# Patient Record
Sex: Male | Born: 1980 | Race: White | Hispanic: No | Marital: Single | Smoking: Never smoker
Health system: Southern US, Community
[De-identification: ages and names within clinical notes are randomized; demographics above are authoritative.]

## PROBLEM LIST (undated history)

## (undated) ENCOUNTER — Emergency Department (HOSPITAL_COMMUNITY): Admission: EM | Payer: PRIVATE HEALTH INSURANCE | Source: Home / Self Care

---
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
## 2008-05-12 ENCOUNTER — Emergency Department (HOSPITAL_COMMUNITY): Admission: EM | Admit: 2008-05-12 | Discharge: 2008-05-12 | Payer: Self-pay | Admitting: Emergency Medicine

## 2010-06-26 ENCOUNTER — Encounter (HOSPITAL_COMMUNITY)
Admission: RE | Admit: 2010-06-26 | Discharge: 2010-09-24 | Payer: Self-pay | Source: Home / Self Care | Attending: Unknown Physician Specialty | Admitting: Unknown Physician Specialty

## 2010-10-09 ENCOUNTER — Encounter (HOSPITAL_COMMUNITY)
Admission: RE | Admit: 2010-10-09 | Discharge: 2010-11-07 | Payer: Self-pay | Source: Home / Self Care | Attending: Unknown Physician Specialty | Admitting: Unknown Physician Specialty

## 2010-12-04 ENCOUNTER — Encounter (HOSPITAL_COMMUNITY): Payer: 59 | Attending: Rheumatology

## 2010-12-04 DIAGNOSIS — H209 Unspecified iridocyclitis: Secondary | ICD-10-CM | POA: Insufficient documentation

## 2011-01-29 ENCOUNTER — Encounter (HOSPITAL_COMMUNITY): Payer: 59 | Attending: Rheumatology

## 2011-01-29 DIAGNOSIS — H209 Unspecified iridocyclitis: Secondary | ICD-10-CM | POA: Insufficient documentation

## 2011-03-26 ENCOUNTER — Encounter (HOSPITAL_COMMUNITY): Payer: Commercial Managed Care - PPO

## 2011-03-26 ENCOUNTER — Encounter (HOSPITAL_COMMUNITY): Payer: 59 | Attending: Rheumatology

## 2011-03-26 DIAGNOSIS — H209 Unspecified iridocyclitis: Secondary | ICD-10-CM | POA: Insufficient documentation

## 2011-05-21 ENCOUNTER — Encounter (HOSPITAL_COMMUNITY): Payer: Commercial Managed Care - PPO

## 2011-05-30 ENCOUNTER — Encounter (HOSPITAL_COMMUNITY): Payer: 59 | Attending: Rheumatology

## 2011-05-30 DIAGNOSIS — H209 Unspecified iridocyclitis: Secondary | ICD-10-CM | POA: Insufficient documentation

## 2011-07-06 LAB — POCT I-STAT, CHEM 8
Calcium, Ion: 1.35 — ABNORMAL HIGH
Creatinine, Ser: 1.7 — ABNORMAL HIGH
Glucose, Bld: 91
Hemoglobin: 12.9 — ABNORMAL LOW
Potassium: 4.3
TCO2: 19

## 2011-07-25 ENCOUNTER — Encounter (HOSPITAL_COMMUNITY): Payer: 59 | Attending: Rheumatology

## 2011-07-25 DIAGNOSIS — H209 Unspecified iridocyclitis: Secondary | ICD-10-CM | POA: Insufficient documentation

## 2011-09-13 ENCOUNTER — Other Ambulatory Visit: Payer: Self-pay | Admitting: Rheumatology

## 2011-09-17 ENCOUNTER — Encounter (HOSPITAL_COMMUNITY)
Admission: RE | Admit: 2011-09-17 | Discharge: 2011-09-17 | Disposition: A | Discharge status: Home / Self Care | Payer: 59 | Source: Ambulatory Visit | Attending: Rheumatology | Admitting: Rheumatology

## 2011-09-17 DIAGNOSIS — H209 Unspecified iridocyclitis: Secondary | ICD-10-CM | POA: Insufficient documentation

## 2011-09-17 MED ORDER — SODIUM CHLORIDE 0.9 % IV SOLN
500.0000 mg | INTRAVENOUS | Status: DC
  Administered 2011-09-17: 500 mg via INTRAVENOUS
  Filled 2011-09-17: qty 50

## 2011-11-12 ENCOUNTER — Encounter (HOSPITAL_COMMUNITY): Payer: 59

## 2011-12-31 ENCOUNTER — Other Ambulatory Visit (HOSPITAL_COMMUNITY): Payer: Self-pay | Admitting: *Deleted

## 2012-01-01 ENCOUNTER — Encounter (HOSPITAL_COMMUNITY)
Admission: RE | Admit: 2012-01-01 | Discharge: 2012-01-01 | Disposition: A | Discharge status: Home / Self Care | Payer: 59 | Source: Ambulatory Visit | Attending: Rheumatology | Admitting: Rheumatology

## 2012-01-01 DIAGNOSIS — H209 Unspecified iridocyclitis: Secondary | ICD-10-CM | POA: Insufficient documentation

## 2012-01-01 MED ORDER — SODIUM CHLORIDE 0.9 % IV SOLN
3.0000 mg/kg | INTRAVENOUS | Status: DC
  Administered 2012-01-01: 500 mg via INTRAVENOUS
  Filled 2012-01-01: qty 50

## 2012-01-01 MED ORDER — ACETAMINOPHEN 325 MG PO TABS: 650.0000 mg | ORAL_TABLET | Freq: Once | ORAL | Status: DC

## 2012-01-08 DIAGNOSIS — H44119 Panuveitis, unspecified eye: Secondary | ICD-10-CM | POA: Insufficient documentation

## 2012-02-26 ENCOUNTER — Encounter (HOSPITAL_COMMUNITY)
Admission: RE | Admit: 2012-02-26 | Discharge: 2012-02-26 | Disposition: A | Discharge status: Home / Self Care | Payer: 59 | Source: Ambulatory Visit | Attending: Rheumatology | Admitting: Rheumatology

## 2012-02-26 DIAGNOSIS — H209 Unspecified iridocyclitis: Secondary | ICD-10-CM | POA: Insufficient documentation

## 2012-02-26 MED ORDER — SODIUM CHLORIDE 0.9 % IV SOLN
3.0000 mg/kg | INTRAVENOUS | Status: DC
  Administered 2012-02-26: 500 mg via INTRAVENOUS
  Filled 2012-02-26: qty 50

## 2012-02-26 MED ORDER — SODIUM CHLORIDE 0.9 % IV SOLN
Freq: Once | INTRAVENOUS | Status: AC
  Administered 2012-02-26: 09:00:00 via INTRAVENOUS

## 2012-02-26 MED ORDER — ACETAMINOPHEN 325 MG PO TABS: 650.0000 mg | ORAL_TABLET | Freq: Once | ORAL | Status: DC

## 2012-04-08 DIAGNOSIS — H44133 Sympathetic uveitis, bilateral: Secondary | ICD-10-CM | POA: Insufficient documentation

## 2012-04-21 ENCOUNTER — Other Ambulatory Visit (HOSPITAL_COMMUNITY): Payer: Self-pay | Admitting: *Deleted

## 2012-04-22 ENCOUNTER — Encounter (HOSPITAL_COMMUNITY)
Admission: RE | Admit: 2012-04-22 | Discharge: 2012-04-22 | Disposition: A | Discharge status: Home / Self Care | Payer: 59 | Source: Ambulatory Visit | Attending: Rheumatology | Admitting: Rheumatology

## 2012-04-22 DIAGNOSIS — H209 Unspecified iridocyclitis: Secondary | ICD-10-CM | POA: Insufficient documentation

## 2012-04-22 MED ORDER — SODIUM CHLORIDE 0.9 % IV SOLN
INTRAVENOUS | Status: DC
  Administered 2012-04-22: 10:00:00 via INTRAVENOUS

## 2012-04-22 MED ORDER — SODIUM CHLORIDE 0.9 % IV SOLN
3.0000 mg/kg | INTRAVENOUS | Status: DC
  Administered 2012-04-22: 500 mg via INTRAVENOUS
  Filled 2012-04-22: qty 50

## 2012-04-22 MED ORDER — ACETAMINOPHEN 325 MG PO TABS: 650.0000 mg | ORAL_TABLET | ORAL | Status: DC

## 2012-06-17 ENCOUNTER — Encounter (HOSPITAL_COMMUNITY)
Admission: RE | Admit: 2012-06-17 | Discharge: 2012-06-17 | Disposition: A | Discharge status: Home / Self Care | Payer: 59 | Source: Ambulatory Visit | Attending: Rheumatology | Admitting: Rheumatology

## 2012-06-17 DIAGNOSIS — H209 Unspecified iridocyclitis: Secondary | ICD-10-CM | POA: Insufficient documentation

## 2012-06-17 MED ORDER — ACETAMINOPHEN 325 MG PO TABS: 650.0000 mg | ORAL_TABLET | ORAL | Status: DC

## 2012-06-17 MED ORDER — SODIUM CHLORIDE 0.9 % IV SOLN
INTRAVENOUS | Status: AC
  Administered 2012-06-17: 08:00:00 via INTRAVENOUS

## 2012-06-17 MED ORDER — SODIUM CHLORIDE 0.9 % IV SOLN
3.0000 mg/kg | INTRAVENOUS | Status: AC
  Administered 2012-06-17: 500 mg via INTRAVENOUS
  Filled 2012-06-17: qty 50

## 2012-07-11 DIAGNOSIS — Z79899 Other long term (current) drug therapy: Secondary | ICD-10-CM | POA: Insufficient documentation

## 2012-08-12 ENCOUNTER — Encounter (HOSPITAL_COMMUNITY): Payer: 59

## 2012-08-18 ENCOUNTER — Encounter (HOSPITAL_COMMUNITY)
Admission: RE | Admit: 2012-08-18 | Discharge: 2012-08-18 | Disposition: A | Discharge status: Home / Self Care | Payer: 59 | Source: Ambulatory Visit | Attending: Rheumatology | Admitting: Rheumatology

## 2012-08-18 DIAGNOSIS — H209 Unspecified iridocyclitis: Secondary | ICD-10-CM | POA: Insufficient documentation

## 2012-08-18 MED ORDER — ACETAMINOPHEN 325 MG PO TABS
ORAL_TABLET | ORAL | Status: AC
  Administered 2012-08-18: 650 mg
  Filled 2012-08-18: qty 2

## 2012-08-18 MED ORDER — SODIUM CHLORIDE 0.9 % IV SOLN
5.0000 mg/kg | INTRAVENOUS | Status: DC
  Administered 2012-08-18: 700 mg via INTRAVENOUS
  Filled 2012-08-18: qty 70

## 2012-08-18 MED ORDER — SODIUM CHLORIDE 0.9 % IV SOLN
INTRAVENOUS | Status: AC
  Administered 2012-08-18: 10:00:00 via INTRAVENOUS

## 2012-08-18 MED ORDER — ACETAMINOPHEN 325 MG PO TABS: 650.0000 mg | ORAL_TABLET | ORAL | Status: DC

## 2012-09-08 ENCOUNTER — Other Ambulatory Visit: Payer: Self-pay | Admitting: Nephrology

## 2012-09-08 DIAGNOSIS — I10 Essential (primary) hypertension: Secondary | ICD-10-CM

## 2012-09-15 ENCOUNTER — Ambulatory Visit
Admission: RE | Admit: 2012-09-15 | Discharge: 2012-09-15 | Disposition: A | Discharge status: Home / Self Care | Payer: 59 | Source: Ambulatory Visit | Attending: Nephrology | Admitting: Nephrology

## 2012-09-15 DIAGNOSIS — I10 Essential (primary) hypertension: Secondary | ICD-10-CM

## 2012-10-10 ENCOUNTER — Other Ambulatory Visit (HOSPITAL_COMMUNITY): Payer: Self-pay | Admitting: *Deleted

## 2012-10-13 ENCOUNTER — Encounter (HOSPITAL_COMMUNITY)
Admission: RE | Admit: 2012-10-13 | Discharge: 2012-10-13 | Disposition: A | Discharge status: Home / Self Care | Payer: 59 | Source: Ambulatory Visit | Attending: Rheumatology | Admitting: Rheumatology

## 2012-10-13 DIAGNOSIS — H209 Unspecified iridocyclitis: Secondary | ICD-10-CM | POA: Insufficient documentation

## 2012-10-13 MED ORDER — SODIUM CHLORIDE 0.9 % IV SOLN
INTRAVENOUS | Status: AC
  Administered 2012-10-13: 09:00:00 via INTRAVENOUS

## 2012-10-13 MED ORDER — SODIUM CHLORIDE 0.9 % IV SOLN
5.0000 mg/kg | INTRAVENOUS | Status: AC
  Administered 2012-10-13: 700 mg via INTRAVENOUS
  Filled 2012-10-13: qty 70

## 2012-10-13 MED ORDER — ACETAMINOPHEN 325 MG PO TABS: 650.0000 mg | ORAL_TABLET | ORAL | Status: DC

## 2012-12-05 ENCOUNTER — Other Ambulatory Visit (HOSPITAL_COMMUNITY): Payer: Self-pay | Admitting: *Deleted

## 2012-12-08 ENCOUNTER — Encounter (HOSPITAL_COMMUNITY)
Admission: RE | Admit: 2012-12-08 | Discharge: 2012-12-08 | Disposition: A | Discharge status: Home / Self Care | Payer: 59 | Source: Ambulatory Visit | Attending: Rheumatology | Admitting: Rheumatology

## 2012-12-08 DIAGNOSIS — H209 Unspecified iridocyclitis: Secondary | ICD-10-CM | POA: Insufficient documentation

## 2012-12-08 MED ORDER — ACETAMINOPHEN 325 MG PO TABS: 650.0000 mg | ORAL_TABLET | ORAL | Status: DC

## 2012-12-08 MED ORDER — SODIUM CHLORIDE 0.9 % IV SOLN
8.0000 mg/kg | INTRAVENOUS | Status: DC
  Administered 2012-12-08: 1100 mg via INTRAVENOUS
  Filled 2012-12-08: qty 110

## 2012-12-08 MED ORDER — SODIUM CHLORIDE 0.9 % IV SOLN
INTRAVENOUS | Status: DC
  Administered 2012-12-08: 250 mL via INTRAVENOUS

## 2013-01-29 ENCOUNTER — Other Ambulatory Visit (HOSPITAL_COMMUNITY): Payer: Self-pay | Admitting: *Deleted

## 2013-02-02 ENCOUNTER — Encounter (HOSPITAL_COMMUNITY): Payer: 59

## 2013-02-06 ENCOUNTER — Other Ambulatory Visit (HOSPITAL_COMMUNITY): Payer: Self-pay | Admitting: *Deleted

## 2013-02-09 ENCOUNTER — Encounter (HOSPITAL_COMMUNITY)
Admission: RE | Admit: 2013-02-09 | Discharge: 2013-02-09 | Disposition: A | Discharge status: Home / Self Care | Payer: 59 | Source: Ambulatory Visit | Attending: Rheumatology | Admitting: Rheumatology

## 2013-02-09 DIAGNOSIS — H209 Unspecified iridocyclitis: Secondary | ICD-10-CM | POA: Insufficient documentation

## 2013-02-09 MED ORDER — SODIUM CHLORIDE 0.9 % IV SOLN
INTRAVENOUS | Status: DC
  Administered 2013-02-09: 08:00:00 via INTRAVENOUS

## 2013-02-09 MED ORDER — SODIUM CHLORIDE 0.9 % IV SOLN
8.0000 mg/kg | INTRAVENOUS | Status: DC
  Administered 2013-02-09: 1100 mg via INTRAVENOUS
  Filled 2013-02-09: qty 110

## 2013-02-09 MED ORDER — ACETAMINOPHEN 325 MG PO TABS: 650.0000 mg | ORAL_TABLET | ORAL | Status: DC

## 2013-03-25 DIAGNOSIS — Z961 Presence of intraocular lens: Secondary | ICD-10-CM | POA: Insufficient documentation

## 2013-04-06 ENCOUNTER — Encounter (HOSPITAL_COMMUNITY)
Admission: RE | Admit: 2013-04-06 | Discharge: 2013-04-06 | Disposition: A | Discharge status: Home / Self Care | Payer: 59 | Source: Ambulatory Visit | Attending: Rheumatology | Admitting: Rheumatology

## 2013-04-06 DIAGNOSIS — H209 Unspecified iridocyclitis: Secondary | ICD-10-CM | POA: Insufficient documentation

## 2013-04-06 MED ORDER — SODIUM CHLORIDE 0.9 % IV SOLN
INTRAVENOUS | Status: AC
  Administered 2013-04-06: 08:00:00 via INTRAVENOUS

## 2013-04-06 MED ORDER — ACETAMINOPHEN 325 MG PO TABS: 650.0000 mg | ORAL_TABLET | ORAL | Status: DC

## 2013-04-06 MED ORDER — SODIUM CHLORIDE 0.9 % IV SOLN
8.0000 mg/kg | INTRAVENOUS | Status: AC
  Administered 2013-04-06: 1100 mg via INTRAVENOUS
  Filled 2013-04-06: qty 110

## 2013-05-15 DIAGNOSIS — H26499 Other secondary cataract, unspecified eye: Secondary | ICD-10-CM | POA: Insufficient documentation

## 2013-06-01 ENCOUNTER — Encounter (HOSPITAL_COMMUNITY): Payer: 59

## 2013-06-15 ENCOUNTER — Encounter (HOSPITAL_COMMUNITY)
Admission: RE | Admit: 2013-06-15 | Discharge: 2013-06-15 | Disposition: A | Discharge status: Home / Self Care | Payer: 59 | Source: Ambulatory Visit | Attending: Rheumatology | Admitting: Rheumatology

## 2013-06-15 DIAGNOSIS — H209 Unspecified iridocyclitis: Secondary | ICD-10-CM | POA: Insufficient documentation

## 2013-06-15 MED ORDER — SODIUM CHLORIDE 0.9 % IV SOLN
8.0000 mg/kg | INTRAVENOUS | Status: DC
  Administered 2013-06-15: 900 mg via INTRAVENOUS
  Filled 2013-06-15: qty 90

## 2013-06-15 MED ORDER — SODIUM CHLORIDE 0.9 % IV SOLN
INTRAVENOUS | Status: DC
  Administered 2013-06-15: 250 mL via INTRAVENOUS

## 2013-06-15 MED ORDER — ACETAMINOPHEN 325 MG PO TABS: 650.0000 mg | ORAL_TABLET | ORAL | Status: DC

## 2013-08-07 ENCOUNTER — Other Ambulatory Visit (HOSPITAL_COMMUNITY): Payer: Self-pay | Admitting: *Deleted

## 2013-08-10 ENCOUNTER — Encounter (HOSPITAL_COMMUNITY)
Admission: RE | Admit: 2013-08-10 | Discharge: 2013-08-10 | Disposition: A | Discharge status: Home / Self Care | Payer: 59 | Source: Ambulatory Visit | Attending: Rheumatology | Admitting: Rheumatology

## 2013-08-10 DIAGNOSIS — H209 Unspecified iridocyclitis: Secondary | ICD-10-CM | POA: Insufficient documentation

## 2013-08-10 MED ORDER — ACETAMINOPHEN 325 MG PO TABS
ORAL_TABLET | ORAL | Status: AC
  Filled 2013-08-10: qty 2

## 2013-08-10 MED ORDER — SODIUM CHLORIDE 0.9 % IV SOLN
8.0000 mg/kg | INTRAVENOUS | Status: DC
  Administered 2013-08-10: 800 mg via INTRAVENOUS
  Filled 2013-08-10: qty 80

## 2013-08-10 MED ORDER — ACETAMINOPHEN 325 MG PO TABS
650.0000 mg | ORAL_TABLET | ORAL | Status: DC
  Administered 2013-08-10: 650 mg via ORAL

## 2013-08-10 MED ORDER — SODIUM CHLORIDE 0.9 % IV SOLN
INTRAVENOUS | Status: DC
  Administered 2013-08-10: 10:00:00 via INTRAVENOUS

## 2013-09-29 ENCOUNTER — Other Ambulatory Visit (HOSPITAL_COMMUNITY): Payer: Self-pay | Admitting: *Deleted

## 2013-10-05 ENCOUNTER — Encounter (HOSPITAL_COMMUNITY): Payer: 59

## 2013-10-16 ENCOUNTER — Other Ambulatory Visit (HOSPITAL_COMMUNITY): Payer: Self-pay

## 2013-10-19 ENCOUNTER — Encounter (HOSPITAL_COMMUNITY)
Admission: RE | Admit: 2013-10-19 | Discharge: 2013-10-19 | Disposition: A | Discharge status: Home / Self Care | Payer: 59 | Source: Ambulatory Visit | Attending: Rheumatology | Admitting: Rheumatology

## 2013-10-19 DIAGNOSIS — H209 Unspecified iridocyclitis: Secondary | ICD-10-CM | POA: Insufficient documentation

## 2013-10-19 MED ORDER — ACETAMINOPHEN 325 MG PO TABS
650.0000 mg | ORAL_TABLET | ORAL | Status: AC
  Administered 2013-10-19: 650 mg via ORAL

## 2013-10-19 MED ORDER — INFLIXIMAB 100 MG IV SOLR
8.0000 mg/kg | INTRAVENOUS | Status: AC
  Administered 2013-10-19: 800 mg via INTRAVENOUS
  Filled 2013-10-19: qty 80

## 2013-10-19 MED ORDER — SODIUM CHLORIDE 0.9 % IV SOLN
INTRAVENOUS | Status: AC
  Administered 2013-10-19: 09:00:00 via INTRAVENOUS

## 2013-10-19 MED ORDER — ACETAMINOPHEN 325 MG PO TABS
ORAL_TABLET | ORAL | Status: AC
  Administered 2013-10-19: 08:00:00 650 mg via ORAL
  Filled 2013-10-19: qty 2

## 2013-12-14 ENCOUNTER — Encounter (HOSPITAL_COMMUNITY)
Admission: RE | Admit: 2013-12-14 | Discharge: 2013-12-14 | Disposition: A | Discharge status: Home / Self Care | Payer: 59 | Source: Ambulatory Visit | Attending: Rheumatology | Admitting: Rheumatology

## 2013-12-14 DIAGNOSIS — H209 Unspecified iridocyclitis: Secondary | ICD-10-CM | POA: Insufficient documentation

## 2013-12-14 MED ORDER — SODIUM CHLORIDE 0.9 % IV SOLN
8.0000 mg/kg | INTRAVENOUS | Status: AC
  Administered 2013-12-14: 800 mg via INTRAVENOUS
  Filled 2013-12-14: qty 80

## 2013-12-14 MED ORDER — ACETAMINOPHEN 325 MG PO TABS
ORAL_TABLET | ORAL | Status: AC
  Filled 2013-12-14: qty 2

## 2013-12-14 MED ORDER — ACETAMINOPHEN 325 MG PO TABS
650.0000 mg | ORAL_TABLET | ORAL | Status: AC
  Administered 2013-12-14: 650 mg via ORAL

## 2013-12-14 MED ORDER — SODIUM CHLORIDE 0.9 % IV SOLN
INTRAVENOUS | Status: AC
  Administered 2013-12-14: 09:00:00 via INTRAVENOUS

## 2014-02-08 ENCOUNTER — Encounter (HOSPITAL_COMMUNITY)
Admission: RE | Admit: 2014-02-08 | Discharge: 2014-02-08 | Disposition: A | Discharge status: Home / Self Care | Payer: 59 | Source: Ambulatory Visit | Attending: Rheumatology | Admitting: Rheumatology

## 2014-02-08 DIAGNOSIS — H209 Unspecified iridocyclitis: Secondary | ICD-10-CM | POA: Insufficient documentation

## 2014-02-08 MED ORDER — SODIUM CHLORIDE 0.9 % IV SOLN
INTRAVENOUS | Status: AC
  Administered 2014-02-08: 09:00:00 via INTRAVENOUS

## 2014-02-08 MED ORDER — SODIUM CHLORIDE 0.9 % IV SOLN
8.0000 mg/kg | INTRAVENOUS | Status: AC
  Administered 2014-02-08: 800 mg via INTRAVENOUS
  Filled 2014-02-08: qty 80

## 2014-02-08 MED ORDER — ACETAMINOPHEN 325 MG PO TABS: 650.0000 mg | ORAL_TABLET | ORAL | Status: DC

## 2014-04-02 ENCOUNTER — Other Ambulatory Visit (HOSPITAL_COMMUNITY): Payer: Self-pay | Admitting: *Deleted

## 2014-04-05 ENCOUNTER — Encounter (HOSPITAL_COMMUNITY)
Admission: RE | Admit: 2014-04-05 | Discharge: 2014-04-05 | Disposition: A | Discharge status: Home / Self Care | Payer: 59 | Source: Ambulatory Visit | Attending: Rheumatology | Admitting: Rheumatology

## 2014-04-05 DIAGNOSIS — H209 Unspecified iridocyclitis: Secondary | ICD-10-CM | POA: Insufficient documentation

## 2014-04-05 LAB — COMPREHENSIVE METABOLIC PANEL
ALT: 36 U/L (ref 0–53)
AST: 63 U/L — ABNORMAL HIGH (ref 0–37)
Albumin: 3.5 g/dL (ref 3.5–5.2)
Alkaline Phosphatase: 71 U/L (ref 39–117)
BUN: 22 mg/dL (ref 6–23)
CALCIUM: 9 mg/dL (ref 8.4–10.5)
CO2: 27 mEq/L (ref 19–32)
CREATININE: 1.26 mg/dL (ref 0.50–1.35)
Chloride: 106 mEq/L (ref 96–112)
GFR calc non Af Amer: 75 mL/min — ABNORMAL LOW (ref >90)
GFR, EST AFRICAN AMERICAN: 87 mL/min — AB (ref >90)
GLUCOSE: 77 mg/dL (ref 70–99)
Potassium: 4.6 mEq/L (ref 3.7–5.3)
Sodium: 144 mEq/L (ref 137–147)
TOTAL PROTEIN: 7.2 g/dL (ref 6.0–8.3)
Total Bilirubin: 0.3 mg/dL (ref 0.3–1.2)

## 2014-04-05 LAB — CBC
HEMATOCRIT: 40.4 % (ref 39.0–52.0)
HEMOGLOBIN: 12.8 g/dL — AB (ref 13.0–17.0)
MCH: 29.6 pg (ref 26.0–34.0)
MCHC: 31.7 g/dL (ref 30.0–36.0)
MCV: 93.3 fL (ref 78.0–100.0)
Platelets: 222 10*3/uL (ref 150–400)
RBC: 4.33 MIL/uL (ref 4.22–5.81)
RDW: 14.7 % (ref 11.5–15.5)
WBC: 3.9 10*3/uL — ABNORMAL LOW (ref 4.0–10.5)

## 2014-04-05 MED ORDER — SODIUM CHLORIDE 0.9 % IV SOLN
8.0000 mg/kg | INTRAVENOUS | Status: DC
  Administered 2014-04-05: 09:00:00 800 mg via INTRAVENOUS
  Filled 2014-04-05: qty 80

## 2014-04-05 MED ORDER — ACETAMINOPHEN 325 MG PO TABS: 650.0000 mg | ORAL_TABLET | ORAL | Status: DC

## 2014-04-05 MED ORDER — SODIUM CHLORIDE 0.9 % IV SOLN
INTRAVENOUS | Status: DC
  Administered 2014-04-05: 09:00:00 via INTRAVENOUS

## 2014-05-31 ENCOUNTER — Encounter (HOSPITAL_COMMUNITY): Payer: 59

## 2014-06-21 ENCOUNTER — Encounter (HOSPITAL_COMMUNITY)
Admission: RE | Admit: 2014-06-21 | Discharge: 2014-06-21 | Disposition: A | Discharge status: Home / Self Care | Payer: 59 | Source: Ambulatory Visit | Attending: Rheumatology | Admitting: Rheumatology

## 2014-06-21 DIAGNOSIS — H209 Unspecified iridocyclitis: Secondary | ICD-10-CM | POA: Diagnosis not present

## 2014-06-21 DIAGNOSIS — G909 Disorder of the autonomic nervous system, unspecified: Secondary | ICD-10-CM | POA: Insufficient documentation

## 2014-06-21 LAB — COMPREHENSIVE METABOLIC PANEL
ALT: 30 U/L (ref 0–53)
ANION GAP: 13 (ref 5–15)
AST: 31 U/L (ref 0–37)
Albumin: 3.7 g/dL (ref 3.5–5.2)
Alkaline Phosphatase: 65 U/L (ref 39–117)
BUN: 25 mg/dL — ABNORMAL HIGH (ref 6–23)
CO2: 25 meq/L (ref 19–32)
CREATININE: 1.26 mg/dL (ref 0.50–1.35)
Calcium: 9.1 mg/dL (ref 8.4–10.5)
Chloride: 103 mEq/L (ref 96–112)
GFR calc Af Amer: 87 mL/min — ABNORMAL LOW (ref >90)
GFR, EST NON AFRICAN AMERICAN: 75 mL/min — AB (ref >90)
Glucose, Bld: 75 mg/dL (ref 70–99)
POTASSIUM: 4.6 meq/L (ref 3.7–5.3)
Sodium: 141 mEq/L (ref 137–147)
Total Bilirubin: 0.8 mg/dL (ref 0.3–1.2)
Total Protein: 7.5 g/dL (ref 6.0–8.3)

## 2014-06-21 LAB — CBC
HCT: 41.9 % (ref 39.0–52.0)
HEMOGLOBIN: 13.8 g/dL (ref 13.0–17.0)
MCH: 29.4 pg (ref 26.0–34.0)
MCHC: 32.9 g/dL (ref 30.0–36.0)
MCV: 89.1 fL (ref 78.0–100.0)
Platelets: 211 10*3/uL (ref 150–400)
RBC: 4.7 MIL/uL (ref 4.22–5.81)
RDW: 13.2 % (ref 11.5–15.5)
WBC: 3.1 10*3/uL — ABNORMAL LOW (ref 4.0–10.5)

## 2014-06-21 MED ORDER — SODIUM CHLORIDE 0.9 % IV SOLN
8.0000 mg/kg | INTRAVENOUS | Status: DC
  Administered 2014-06-21: 800 mg via INTRAVENOUS
  Filled 2014-06-21: qty 80

## 2014-06-21 MED ORDER — SODIUM CHLORIDE 0.9 % IV SOLN
INTRAVENOUS | Status: DC
  Administered 2014-06-21: 09:00:00 via INTRAVENOUS

## 2014-06-21 MED ORDER — ACETAMINOPHEN 325 MG PO TABS: 650.0000 mg | ORAL_TABLET | ORAL | Status: DC

## 2014-08-23 ENCOUNTER — Encounter (HOSPITAL_COMMUNITY): Payer: 59

## 2014-08-27 ENCOUNTER — Other Ambulatory Visit (HOSPITAL_COMMUNITY): Payer: Self-pay | Admitting: *Deleted

## 2014-08-30 ENCOUNTER — Encounter (HOSPITAL_COMMUNITY)
Admission: RE | Admit: 2014-08-30 | Discharge: 2014-08-30 | Disposition: A | Discharge status: Home / Self Care | Payer: 59 | Source: Ambulatory Visit | Attending: Rheumatology | Admitting: Rheumatology

## 2014-08-30 DIAGNOSIS — H209 Unspecified iridocyclitis: Secondary | ICD-10-CM | POA: Insufficient documentation

## 2014-08-30 DIAGNOSIS — G909 Disorder of the autonomic nervous system, unspecified: Secondary | ICD-10-CM | POA: Diagnosis not present

## 2014-08-30 LAB — COMPREHENSIVE METABOLIC PANEL
ALBUMIN: 3.6 g/dL (ref 3.5–5.2)
ALT: 28 U/L (ref 0–53)
ANION GAP: 10 (ref 5–15)
AST: 34 U/L (ref 0–37)
Alkaline Phosphatase: 57 U/L (ref 39–117)
BILIRUBIN TOTAL: 0.6 mg/dL (ref 0.3–1.2)
BUN: 24 mg/dL — AB (ref 6–23)
CO2: 27 mEq/L (ref 19–32)
CREATININE: 1.32 mg/dL (ref 0.50–1.35)
Calcium: 9.3 mg/dL (ref 8.4–10.5)
Chloride: 103 mEq/L (ref 96–112)
GFR calc Af Amer: 82 mL/min — ABNORMAL LOW (ref >90)
GFR calc non Af Amer: 71 mL/min — ABNORMAL LOW (ref >90)
Glucose, Bld: 81 mg/dL (ref 70–99)
POTASSIUM: 4.5 meq/L (ref 3.7–5.3)
Sodium: 140 mEq/L (ref 137–147)
TOTAL PROTEIN: 7.2 g/dL (ref 6.0–8.3)

## 2014-08-30 LAB — CBC
HCT: 39.7 % (ref 39.0–52.0)
Hemoglobin: 13 g/dL (ref 13.0–17.0)
MCH: 28.6 pg (ref 26.0–34.0)
MCHC: 32.7 g/dL (ref 30.0–36.0)
MCV: 87.3 fL (ref 78.0–100.0)
PLATELETS: 204 10*3/uL (ref 150–400)
RBC: 4.55 MIL/uL (ref 4.22–5.81)
RDW: 13.5 % (ref 11.5–15.5)
WBC: 3.3 10*3/uL — AB (ref 4.0–10.5)

## 2014-08-30 MED ORDER — SODIUM CHLORIDE 0.9 % IV SOLN
INTRAVENOUS | Status: DC
  Administered 2014-08-30: 09:00:00 via INTRAVENOUS

## 2014-08-30 MED ORDER — ACETAMINOPHEN 325 MG PO TABS
650.0000 mg | ORAL_TABLET | Freq: Four times a day (QID) | ORAL | Status: DC | PRN
Start: 2014-08-30 — End: 2014-08-31

## 2014-08-30 MED ORDER — SODIUM CHLORIDE 0.9 % IV SOLN
8.0000 mg/kg | INTRAVENOUS | Status: DC
  Administered 2014-08-30: 800 mg via INTRAVENOUS
  Filled 2014-08-30: qty 80

## 2014-09-07 DIAGNOSIS — I10 Essential (primary) hypertension: Secondary | ICD-10-CM | POA: Insufficient documentation

## 2014-10-01 IMAGING — US US RENAL ARTERY STENOSIS
1 series · 13 of 25 positions shown · non-contrast
Comparison: None.

RENAL/URINARY TRACT ULTRASOUND

CLINICAL DATA: Hypertension.  Chronic kidney disease.

RENAL/URINARY TRACT ULTRASOUND
RENAL DUPLEX ULTRASOUND
TECHNIQUE: Routine ultrasound examination of the kidneys and
urinary tract was performed.  Duplex and color Doppler ultrasound
was utilized to evaluate blood flow in the renal arteries and
kidneys.

[Series 1: us renal artery stenosis · 0.33mm/px · 13 of 70 slices shown]
[im 1/70]
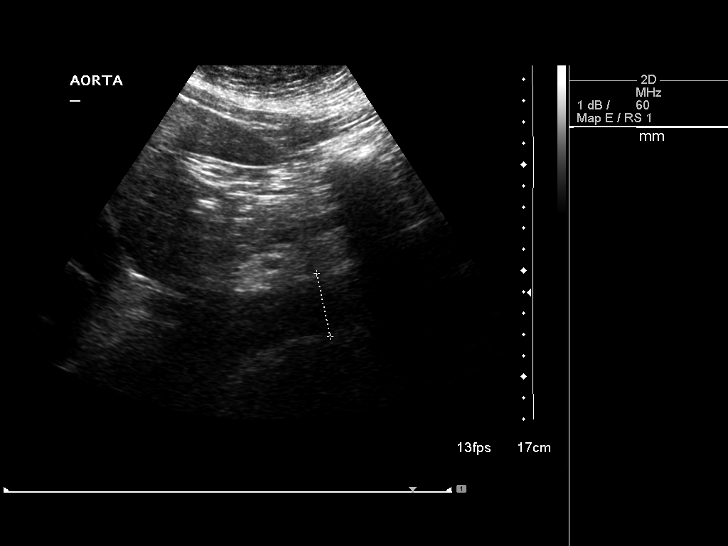
[im 6/70]
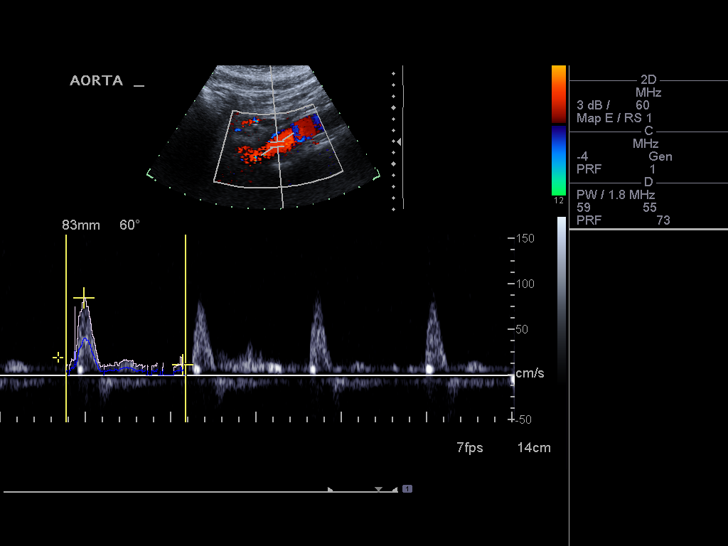
[im 12/70]
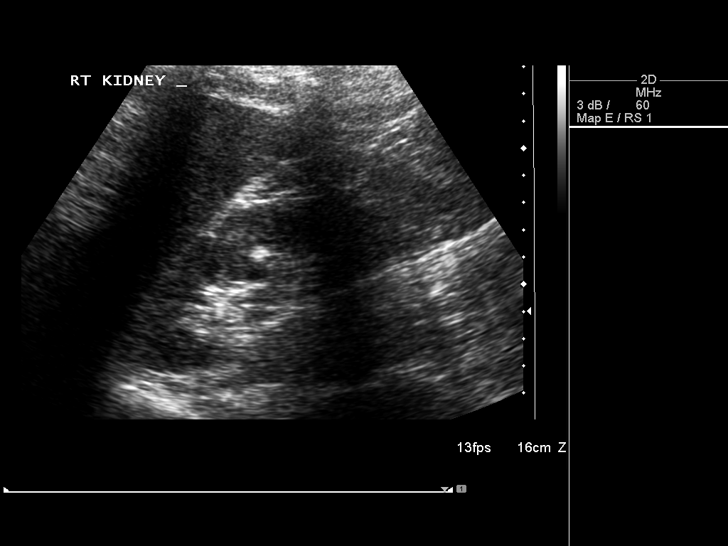
[im 18/70]
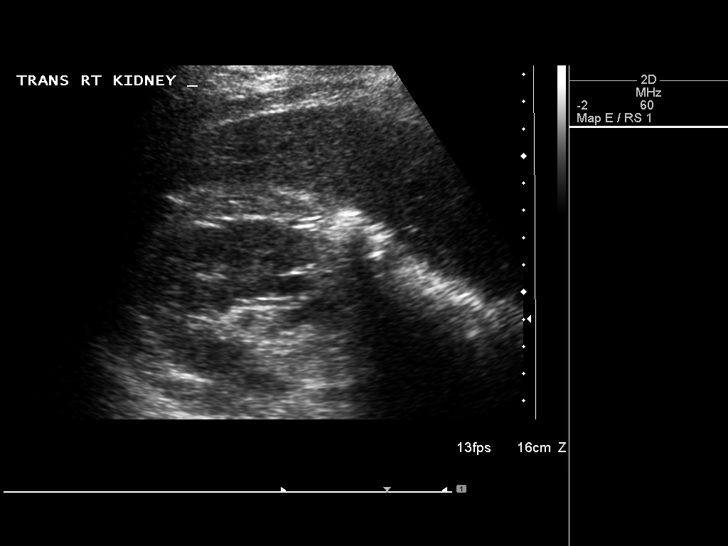
[im 24/70]
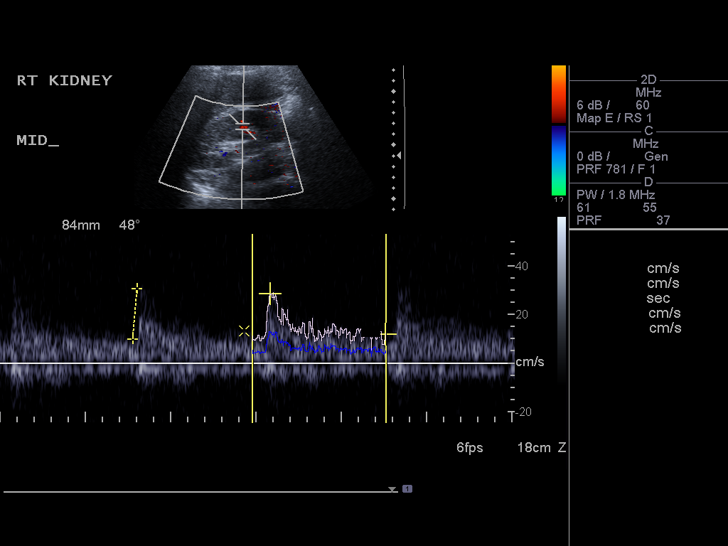
[im 29/70]
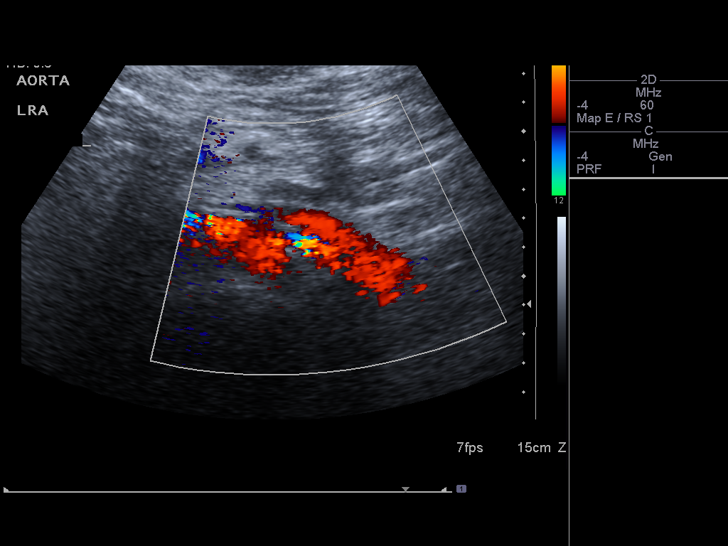
[im 35/70]
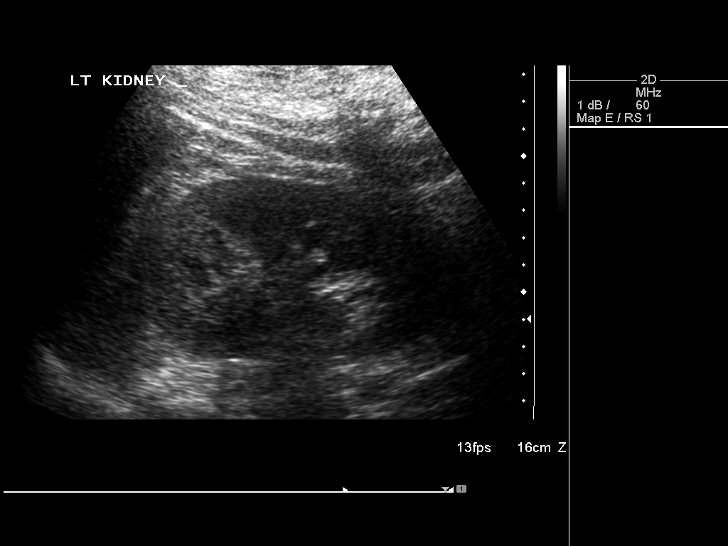
[im 41/70]
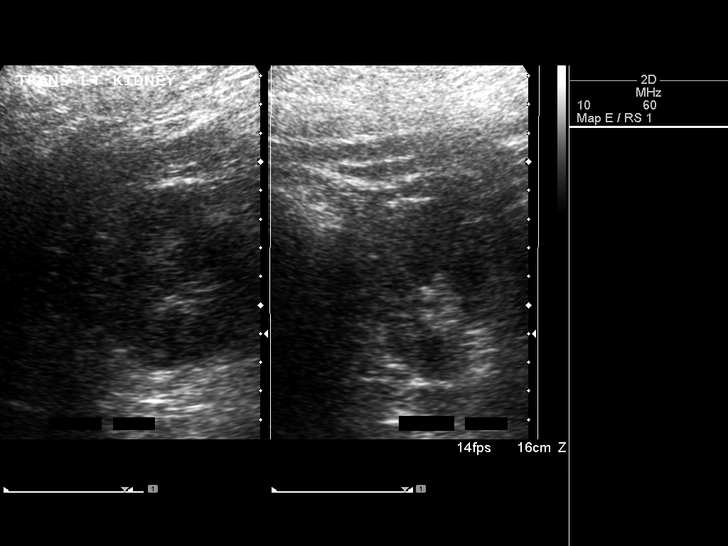
[im 47/70]
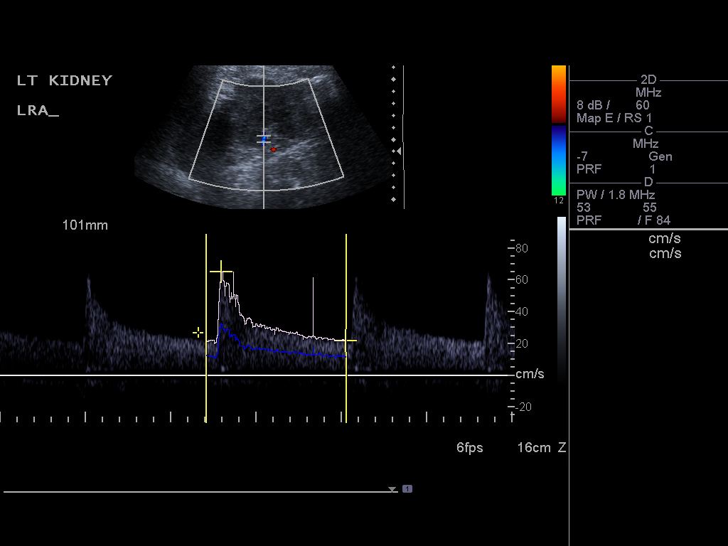
[im 52/70]
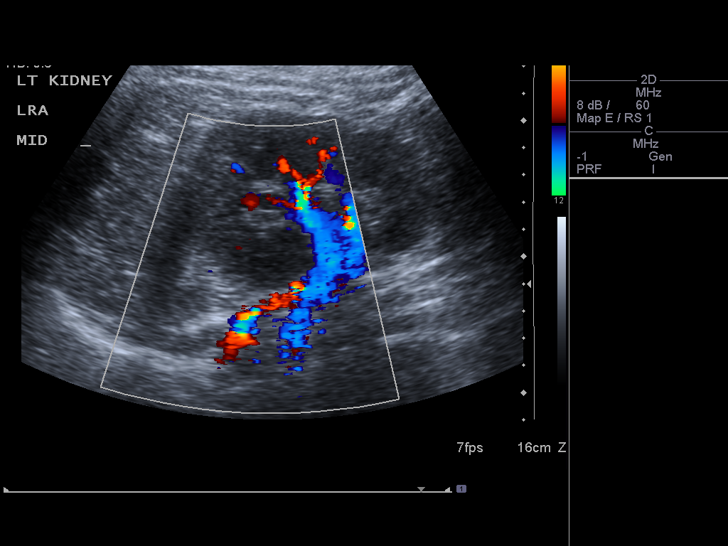
[im 58/70]
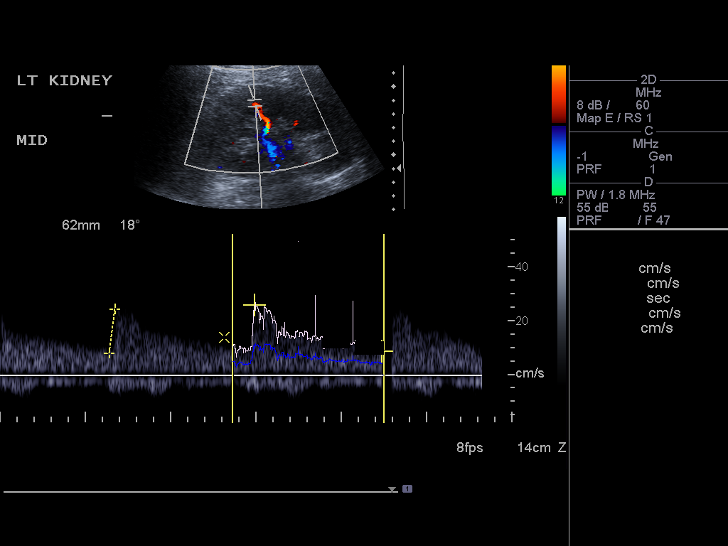
[im 64/70]
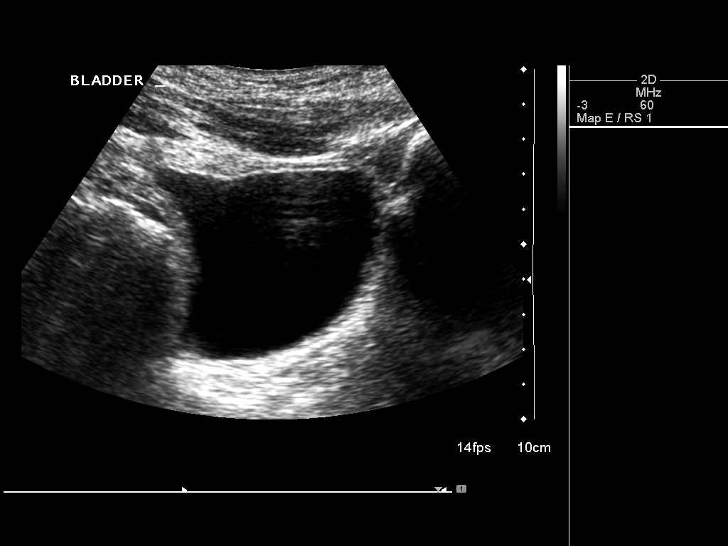
[im 70/70]
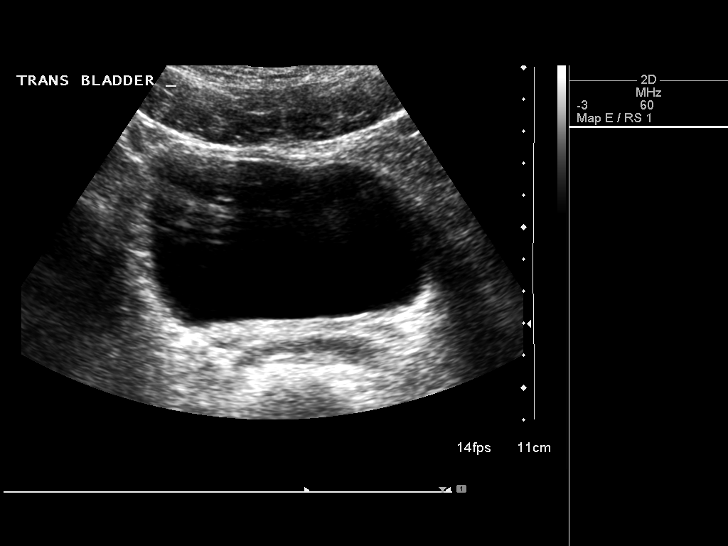

[13 of 25 positions shown; findings below may reference images not displayed]

FINDINGS: Right Kidney:  11.3 cm in length.  No hydronephrosis or mass.
Cortical echogenicity is within normal limits.

Left Kidney:  11.4 cm in length.  No hydronephrosis or mass.
Cortical echogenicity is within normal limits.

Bladder:  Within normal limits.

RENAL DUPLEX ULTRASOUND

Right Renal Artery Velocities

Origin: 99 cm/sec
Mid: 86 cm/sec
Hilum: 64 cm/sec
Interlobar: 42 cm/sec
Arcuate: 29 cm/sec

Left Renal Artery Velocities

Origin: 137 cm/sec
Mid: 111 cm/sec
Hilum: 60 cm/sec
Interlobar: 34 cm/sec
Arcuate: 26 cm/sec

Aortic Velocity: 85 cm/sec

Right Renal Aortic Ratios

Origin:
Mid:
Hilum:
Interlobar:
Arcuate:

Left Renal Aortic Ratios

Origin:
Mid:
Hilum:
Interlobar:
Arcuate:0.31
FINDINGS: Renal veins are patent bilaterally.  No perinephric
fluid collections. Renal artery Doppler wave form analysis
demonstrates a low resistance pattern with a sharp upstroke.
IMPRESSION: Kidneys are within normal limits.  No evidence of renal artery
stenosis.

## 2014-10-25 ENCOUNTER — Encounter (HOSPITAL_COMMUNITY)
Admission: RE | Admit: 2014-10-25 | Discharge: 2014-10-25 | Disposition: A | Discharge status: Home / Self Care | Payer: 59 | Source: Ambulatory Visit | Attending: Rheumatology | Admitting: Rheumatology

## 2014-10-25 DIAGNOSIS — H209 Unspecified iridocyclitis: Secondary | ICD-10-CM | POA: Insufficient documentation

## 2014-10-25 DIAGNOSIS — G909 Disorder of the autonomic nervous system, unspecified: Secondary | ICD-10-CM | POA: Diagnosis present

## 2014-10-25 LAB — COMPREHENSIVE METABOLIC PANEL
ALT: 47 U/L (ref 0–53)
AST: 66 U/L — ABNORMAL HIGH (ref 0–37)
Albumin: 3.5 g/dL (ref 3.5–5.2)
Alkaline Phosphatase: 37 U/L — ABNORMAL LOW (ref 39–117)
Anion gap: 3 — ABNORMAL LOW (ref 5–15)
BUN: 20 mg/dL (ref 6–23)
CALCIUM: 9.3 mg/dL (ref 8.4–10.5)
CHLORIDE: 100 meq/L (ref 96–112)
CO2: 33 mmol/L — ABNORMAL HIGH (ref 19–32)
Creatinine, Ser: 1.39 mg/dL — ABNORMAL HIGH (ref 0.50–1.35)
GFR calc Af Amer: 77 mL/min — ABNORMAL LOW (ref >90)
GFR calc non Af Amer: 66 mL/min — ABNORMAL LOW (ref >90)
GLUCOSE: 86 mg/dL (ref 70–99)
Potassium: 4 mmol/L (ref 3.5–5.1)
SODIUM: 136 mmol/L (ref 135–145)
Total Bilirubin: 1 mg/dL (ref 0.3–1.2)
Total Protein: 6.6 g/dL (ref 6.0–8.3)

## 2014-10-25 LAB — CBC
HCT: 41.5 % (ref 39.0–52.0)
Hemoglobin: 14 g/dL (ref 13.0–17.0)
MCH: 30.2 pg (ref 26.0–34.0)
MCHC: 33.7 g/dL (ref 30.0–36.0)
MCV: 89.6 fL (ref 78.0–100.0)
Platelets: 207 10*3/uL (ref 150–400)
RBC: 4.63 MIL/uL (ref 4.22–5.81)
RDW: 14.2 % (ref 11.5–15.5)
WBC: 5.4 10*3/uL (ref 4.0–10.5)

## 2014-10-25 MED ORDER — ACETAMINOPHEN 325 MG PO TABS: 650.0000 mg | ORAL_TABLET | Freq: Four times a day (QID) | ORAL | Status: DC | PRN

## 2014-10-25 MED ORDER — SODIUM CHLORIDE 0.9 % IV SOLN
INTRAVENOUS | Status: DC
  Administered 2014-10-25: 09:00:00 via INTRAVENOUS

## 2014-10-25 MED ORDER — INFLIXIMAB 100 MG IV SOLR
8.0000 mg/kg | INTRAVENOUS | Status: DC
  Administered 2014-10-25: 800 mg via INTRAVENOUS
  Filled 2014-10-25: qty 80

## 2014-12-20 ENCOUNTER — Encounter (HOSPITAL_COMMUNITY): Payer: 59

## 2014-12-24 ENCOUNTER — Other Ambulatory Visit (HOSPITAL_COMMUNITY): Payer: Self-pay | Admitting: *Deleted

## 2014-12-27 ENCOUNTER — Encounter (HOSPITAL_COMMUNITY)
Admission: RE | Admit: 2014-12-27 | Discharge: 2014-12-27 | Disposition: A | Discharge status: Home / Self Care | Payer: 59 | Source: Ambulatory Visit | Attending: Rheumatology | Admitting: Rheumatology

## 2014-12-27 ENCOUNTER — Encounter (HOSPITAL_COMMUNITY): Payer: 59

## 2014-12-27 DIAGNOSIS — H209 Unspecified iridocyclitis: Secondary | ICD-10-CM | POA: Diagnosis not present

## 2014-12-27 DIAGNOSIS — G909 Disorder of the autonomic nervous system, unspecified: Secondary | ICD-10-CM | POA: Insufficient documentation

## 2014-12-27 LAB — CBC
HCT: 45.6 % (ref 39.0–52.0)
HEMOGLOBIN: 14.8 g/dL (ref 13.0–17.0)
MCH: 30.5 pg (ref 26.0–34.0)
MCHC: 32.5 g/dL (ref 30.0–36.0)
MCV: 93.8 fL (ref 78.0–100.0)
PLATELETS: 176 10*3/uL (ref 150–400)
RBC: 4.86 MIL/uL (ref 4.22–5.81)
RDW: 13.9 % (ref 11.5–15.5)
WBC: 5.4 10*3/uL (ref 4.0–10.5)

## 2014-12-27 LAB — COMPREHENSIVE METABOLIC PANEL
ALBUMIN: 3.4 g/dL — AB (ref 3.5–5.2)
ALK PHOS: 40 U/L (ref 39–117)
ALT: 22 U/L (ref 0–53)
AST: 47 U/L — ABNORMAL HIGH (ref 0–37)
Anion gap: 7 (ref 5–15)
BUN: 20 mg/dL (ref 6–23)
CO2: 29 mmol/L (ref 19–32)
Calcium: 9.1 mg/dL (ref 8.4–10.5)
Chloride: 104 mmol/L (ref 96–112)
Creatinine, Ser: 1.4 mg/dL — ABNORMAL HIGH (ref 0.50–1.35)
GFR calc Af Amer: 76 mL/min — ABNORMAL LOW (ref >90)
GFR calc non Af Amer: 66 mL/min — ABNORMAL LOW (ref >90)
Glucose, Bld: 79 mg/dL (ref 70–99)
POTASSIUM: 5.1 mmol/L (ref 3.5–5.1)
SODIUM: 140 mmol/L (ref 135–145)
TOTAL PROTEIN: 6.5 g/dL (ref 6.0–8.3)
Total Bilirubin: 1.4 mg/dL — ABNORMAL HIGH (ref 0.3–1.2)

## 2014-12-27 MED ORDER — SODIUM CHLORIDE 0.9 % IV SOLN
INTRAVENOUS | Status: DC
  Administered 2014-12-27: 08:00:00 via INTRAVENOUS

## 2014-12-27 MED ORDER — ACETAMINOPHEN 325 MG PO TABS: 650.0000 mg | ORAL_TABLET | Freq: Four times a day (QID) | ORAL | Status: DC | PRN

## 2014-12-27 MED ORDER — SODIUM CHLORIDE 0.9 % IV SOLN
8.0000 mg/kg | INTRAVENOUS | Status: DC
  Administered 2014-12-27: 900 mg via INTRAVENOUS
  Filled 2014-12-27: qty 90

## 2015-02-21 ENCOUNTER — Encounter (HOSPITAL_COMMUNITY)
Admission: RE | Admit: 2015-02-21 | Discharge: 2015-02-21 | Disposition: A | Discharge status: Home / Self Care | Payer: 59 | Source: Ambulatory Visit | Attending: Rheumatology | Admitting: Rheumatology

## 2015-02-21 DIAGNOSIS — H209 Unspecified iridocyclitis: Secondary | ICD-10-CM | POA: Diagnosis not present

## 2015-02-21 DIAGNOSIS — G909 Disorder of the autonomic nervous system, unspecified: Secondary | ICD-10-CM | POA: Diagnosis not present

## 2015-02-21 LAB — COMPREHENSIVE METABOLIC PANEL
ALK PHOS: 48 U/L (ref 38–126)
ALT: 18 U/L (ref 17–63)
ANION GAP: 7 (ref 5–15)
AST: 28 U/L (ref 15–41)
Albumin: 3.3 g/dL — ABNORMAL LOW (ref 3.5–5.0)
BUN: 19 mg/dL (ref 6–20)
CHLORIDE: 106 mmol/L (ref 101–111)
CO2: 27 mmol/L (ref 22–32)
Calcium: 9 mg/dL (ref 8.9–10.3)
Creatinine, Ser: 1.33 mg/dL — ABNORMAL HIGH (ref 0.61–1.24)
GFR calc Af Amer: >60 mL/min (ref >60)
Glucose, Bld: 101 mg/dL — ABNORMAL HIGH (ref 65–99)
POTASSIUM: 4.1 mmol/L (ref 3.5–5.1)
Sodium: 140 mmol/L (ref 135–145)
Total Bilirubin: 1.3 mg/dL — ABNORMAL HIGH (ref 0.3–1.2)
Total Protein: 6.1 g/dL — ABNORMAL LOW (ref 6.5–8.1)

## 2015-02-21 LAB — CBC
HEMATOCRIT: 42.5 % (ref 39.0–52.0)
Hemoglobin: 13.8 g/dL (ref 13.0–17.0)
MCH: 29.6 pg (ref 26.0–34.0)
MCHC: 32.5 g/dL (ref 30.0–36.0)
MCV: 91.2 fL (ref 78.0–100.0)
Platelets: 190 10*3/uL (ref 150–400)
RBC: 4.66 MIL/uL (ref 4.22–5.81)
RDW: 12.9 % (ref 11.5–15.5)
WBC: 4.3 10*3/uL (ref 4.0–10.5)

## 2015-02-21 MED ORDER — ACETAMINOPHEN 325 MG PO TABS
650.0000 mg | ORAL_TABLET | Freq: Four times a day (QID) | ORAL | Status: DC | PRN
Start: 2015-02-21 — End: 2015-02-22

## 2015-02-21 MED ORDER — SODIUM CHLORIDE 0.9 % IV SOLN
INTRAVENOUS | Status: DC
  Administered 2015-02-21: 250 mL via INTRAVENOUS

## 2015-02-21 MED ORDER — INFLIXIMAB 100 MG IV SOLR
8.0000 mg/kg | INTRAVENOUS | Status: DC
  Administered 2015-02-21: 900 mg via INTRAVENOUS
  Filled 2015-02-21: qty 90

## 2015-03-08 ENCOUNTER — Other Ambulatory Visit: Payer: Self-pay | Admitting: *Deleted

## 2015-03-08 NOTE — Patient Outreach (Signed)
Attempt #1 made to contact [employer] member- f/u on high cost claim, assess if any needs.   HIPPA compliant voice message left with contact number.   Plan to try again.     Shayne Alkenose M.   Pierzchala RN CCM [facility]  [phone]

## 2015-03-21 ENCOUNTER — Other Ambulatory Visit: Payer: Self-pay | Admitting: *Deleted

## 2015-03-21 NOTE — Patient Outreach (Signed)
Called number again for [facility] member as was disconnected, voice message received.   HIPPA compliant voice message left with contact number.   With this being second attempt, plan to call one more time, if no response, plan to close case.     Shayne Alken.   Carley Strickling RN CCM [facility] Care Management  [phone]

## 2015-03-21 NOTE — Patient Outreach (Signed)
Called the number  for [employer] member, male answered the phone, attempt made to verify HIPPA, phone disconnected.  Plan to call back.     Shayne Alken.   Grae Leathers RN CCM [facility] Care Management  [phone]

## 2015-03-22 ENCOUNTER — Other Ambulatory Visit: Payer: Self-pay | Admitting: *Deleted

## 2015-03-22 NOTE — Patient Outreach (Signed)
Third attempt made to contact [employer] member, discuss [facility] services, assess if case management needs.   HIPPA compliant voice message left with contact number.  With this being the third attempt, if no response, plan to close case.     Shayne Alken.   Pierzchala RN CCM [facility] Care Management  [phone]

## 2015-04-18 ENCOUNTER — Encounter (HOSPITAL_COMMUNITY)
Admission: RE | Admit: 2015-04-18 | Discharge: 2015-04-18 | Disposition: A | Discharge status: Home / Self Care | Payer: 59 | Source: Ambulatory Visit | Attending: Rheumatology | Admitting: Rheumatology

## 2015-04-18 DIAGNOSIS — G909 Disorder of the autonomic nervous system, unspecified: Secondary | ICD-10-CM | POA: Diagnosis present

## 2015-04-18 DIAGNOSIS — H209 Unspecified iridocyclitis: Secondary | ICD-10-CM | POA: Insufficient documentation

## 2015-04-18 LAB — COMPREHENSIVE METABOLIC PANEL
ALT: 30 U/L (ref 17–63)
ANION GAP: 5 (ref 5–15)
AST: 34 U/L (ref 15–41)
Albumin: 3.5 g/dL (ref 3.5–5.0)
Alkaline Phosphatase: 61 U/L (ref 38–126)
BUN: 20 mg/dL (ref 6–20)
CO2: 27 mmol/L (ref 22–32)
CREATININE: 1.42 mg/dL — AB (ref 0.61–1.24)
Calcium: 9 mg/dL (ref 8.9–10.3)
Chloride: 107 mmol/L (ref 101–111)
Glucose, Bld: 84 mg/dL (ref 65–99)
POTASSIUM: 4.3 mmol/L (ref 3.5–5.1)
SODIUM: 139 mmol/L (ref 135–145)
Total Bilirubin: 1.1 mg/dL (ref 0.3–1.2)
Total Protein: 6.4 g/dL — ABNORMAL LOW (ref 6.5–8.1)

## 2015-04-18 LAB — CBC
HCT: 43.5 % (ref 39.0–52.0)
Hemoglobin: 14.5 g/dL (ref 13.0–17.0)
MCH: 29.5 pg (ref 26.0–34.0)
MCHC: 33.3 g/dL (ref 30.0–36.0)
MCV: 88.4 fL (ref 78.0–100.0)
Platelets: 210 10*3/uL (ref 150–400)
RBC: 4.92 MIL/uL (ref 4.22–5.81)
RDW: 12.7 % (ref 11.5–15.5)
WBC: 3.7 10*3/uL — ABNORMAL LOW (ref 4.0–10.5)

## 2015-04-18 MED ORDER — SODIUM CHLORIDE 0.9 % IV SOLN
INTRAVENOUS | Status: DC
  Administered 2015-04-18: 09:00:00 via INTRAVENOUS

## 2015-04-18 MED ORDER — ACETAMINOPHEN 325 MG PO TABS
650.0000 mg | ORAL_TABLET | Freq: Four times a day (QID) | ORAL | Status: DC | PRN
Start: 2015-04-18 — End: 2015-04-19

## 2015-04-18 MED ORDER — SODIUM CHLORIDE 0.9 % IV SOLN
8.0000 mg/kg | INTRAVENOUS | Status: DC
  Administered 2015-04-18: 900 mg via INTRAVENOUS
  Filled 2015-04-18: qty 90

## 2015-05-27 DIAGNOSIS — H3581 Retinal edema: Secondary | ICD-10-CM | POA: Insufficient documentation

## 2015-06-10 ENCOUNTER — Other Ambulatory Visit (HOSPITAL_COMMUNITY): Payer: Self-pay | Admitting: *Deleted

## 2015-06-14 ENCOUNTER — Inpatient Hospital Stay (HOSPITAL_COMMUNITY): Admission: RE | Admit: 2015-06-14 | Payer: 59 | Source: Ambulatory Visit

## 2015-07-01 ENCOUNTER — Other Ambulatory Visit (HOSPITAL_COMMUNITY): Payer: Self-pay | Admitting: *Deleted

## 2015-07-04 ENCOUNTER — Encounter (HOSPITAL_COMMUNITY)
Admission: RE | Admit: 2015-07-04 | Discharge: 2015-07-04 | Disposition: A | Discharge status: Home / Self Care | Payer: 59 | Source: Ambulatory Visit | Attending: Rheumatology | Admitting: Rheumatology

## 2015-07-04 DIAGNOSIS — H209 Unspecified iridocyclitis: Secondary | ICD-10-CM | POA: Insufficient documentation

## 2015-07-04 DIAGNOSIS — G909 Disorder of the autonomic nervous system, unspecified: Secondary | ICD-10-CM | POA: Insufficient documentation

## 2015-07-04 LAB — COMPREHENSIVE METABOLIC PANEL
ALBUMIN: 3.5 g/dL (ref 3.5–5.0)
ALT: 26 U/L (ref 17–63)
AST: 36 U/L (ref 15–41)
Alkaline Phosphatase: 67 U/L (ref 38–126)
Anion gap: 9 (ref 5–15)
BILIRUBIN TOTAL: 0.7 mg/dL (ref 0.3–1.2)
BUN: 20 mg/dL (ref 6–20)
CO2: 27 mmol/L (ref 22–32)
Calcium: 9.4 mg/dL (ref 8.9–10.3)
Chloride: 106 mmol/L (ref 101–111)
Creatinine, Ser: 1.54 mg/dL — ABNORMAL HIGH (ref 0.61–1.24)
GFR calc Af Amer: >60 mL/min (ref >60)
GFR, EST NON AFRICAN AMERICAN: 59 mL/min — AB (ref >60)
GLUCOSE: 64 mg/dL — AB (ref 65–99)
POTASSIUM: 4 mmol/L (ref 3.5–5.1)
Sodium: 142 mmol/L (ref 135–145)
TOTAL PROTEIN: 7 g/dL (ref 6.5–8.1)

## 2015-07-04 LAB — CBC WITH DIFFERENTIAL/PLATELET
BASOS PCT: 0 %
Basophils Absolute: 0 10*3/uL (ref 0.0–0.1)
Eosinophils Absolute: 0 10*3/uL (ref 0.0–0.7)
Eosinophils Relative: 1 %
HEMATOCRIT: 46 % (ref 39.0–52.0)
Hemoglobin: 15 g/dL (ref 13.0–17.0)
Lymphocytes Relative: 30 %
Lymphs Abs: 1.4 10*3/uL (ref 0.7–4.0)
MCH: 28.5 pg (ref 26.0–34.0)
MCHC: 32.6 g/dL (ref 30.0–36.0)
MCV: 87.5 fL (ref 78.0–100.0)
MONOS PCT: 13 %
Monocytes Absolute: 0.6 10*3/uL (ref 0.1–1.0)
NEUTROS ABS: 2.7 10*3/uL (ref 1.7–7.7)
Neutrophils Relative %: 56 %
Platelets: 226 10*3/uL (ref 150–400)
RBC: 5.26 MIL/uL (ref 4.22–5.81)
RDW: 13.1 % (ref 11.5–15.5)
WBC: 4.8 10*3/uL (ref 4.0–10.5)

## 2015-07-04 MED ORDER — SODIUM CHLORIDE 0.9 % IV SOLN
8.0000 mg/kg | INTRAVENOUS | Status: AC
  Administered 2015-07-04: 1000 mg via INTRAVENOUS
  Filled 2015-07-04: qty 100

## 2015-07-04 MED ORDER — SODIUM CHLORIDE 0.9 % IV SOLN
INTRAVENOUS | Status: AC
  Administered 2015-07-04: 08:00:00 via INTRAVENOUS

## 2015-07-04 MED ORDER — ACETAMINOPHEN 325 MG PO TABS: 650.0000 mg | ORAL_TABLET | ORAL | Status: DC

## 2015-08-07 DIAGNOSIS — H30033 Focal chorioretinal inflammation, peripheral, bilateral: Secondary | ICD-10-CM | POA: Insufficient documentation

## 2015-08-26 ENCOUNTER — Other Ambulatory Visit (HOSPITAL_COMMUNITY): Payer: Self-pay | Admitting: *Deleted

## 2015-08-29 ENCOUNTER — Encounter (HOSPITAL_COMMUNITY)
Admission: RE | Admit: 2015-08-29 | Discharge: 2015-08-29 | Disposition: A | Discharge status: Home / Self Care | Payer: 59 | Source: Ambulatory Visit | Attending: Rheumatology | Admitting: Rheumatology

## 2015-08-29 DIAGNOSIS — H209 Unspecified iridocyclitis: Secondary | ICD-10-CM | POA: Diagnosis not present

## 2015-08-29 DIAGNOSIS — G909 Disorder of the autonomic nervous system, unspecified: Secondary | ICD-10-CM | POA: Diagnosis present

## 2015-08-29 MED ORDER — SODIUM CHLORIDE 0.9 % IV SOLN: INTRAVENOUS | Status: DC

## 2015-08-29 MED ORDER — SODIUM CHLORIDE 0.9 % IV SOLN
8.0000 mg/kg | INTRAVENOUS | Status: DC
  Administered 2015-08-29: 1000 mg via INTRAVENOUS
  Filled 2015-08-29: qty 100

## 2015-08-29 MED ORDER — ACETAMINOPHEN 325 MG PO TABS: 650.0000 mg | ORAL_TABLET | ORAL | Status: DC

## 2015-10-14 MED FILL — ACYCLOVIR 800 MG TABLET: 800 | 30 days supply | Qty: 60 | Fill #0

## 2015-10-20 MED FILL — azaTHIOprine 50 MG TABS: 50 | 30 days supply | Qty: 180 | Fill #0

## 2015-10-24 ENCOUNTER — Encounter (HOSPITAL_COMMUNITY)
Admission: RE | Admit: 2015-10-24 | Discharge: 2015-10-24 | Disposition: A | Discharge status: Home / Self Care | Payer: 59 | Source: Ambulatory Visit | Attending: Rheumatology | Admitting: Rheumatology

## 2015-10-24 DIAGNOSIS — H209 Unspecified iridocyclitis: Secondary | ICD-10-CM | POA: Insufficient documentation

## 2015-10-24 DIAGNOSIS — G909 Disorder of the autonomic nervous system, unspecified: Secondary | ICD-10-CM | POA: Diagnosis not present

## 2015-10-24 MED ORDER — SODIUM CHLORIDE 0.9 % IV SOLN
8.0000 mg/kg | INTRAVENOUS | Status: DC
Start: 2015-10-24 — End: 2015-10-25
  Administered 2015-10-24: 1000 mg via INTRAVENOUS
  Filled 2015-10-24: qty 100

## 2015-10-24 MED ORDER — SODIUM CHLORIDE 0.9 % IV SOLN
INTRAVENOUS | Status: DC
  Administered 2015-10-24: 09:00:00 via INTRAVENOUS

## 2015-10-24 MED ORDER — ACETAMINOPHEN 325 MG PO TABS: 650.0000 mg | ORAL_TABLET | ORAL | Status: DC

## 2015-11-02 DIAGNOSIS — T85318A Breakdown (mechanical) of other ocular prosthetic devices, implants and grafts, initial encounter: Secondary | ICD-10-CM | POA: Diagnosis not present

## 2015-11-02 DIAGNOSIS — Z87891 Personal history of nicotine dependence: Secondary | ICD-10-CM | POA: Diagnosis not present

## 2015-11-02 DIAGNOSIS — Z9889 Other specified postprocedural states: Secondary | ICD-10-CM | POA: Diagnosis not present

## 2015-11-02 DIAGNOSIS — H209 Unspecified iridocyclitis: Secondary | ICD-10-CM | POA: Diagnosis not present

## 2015-11-02 DIAGNOSIS — T83729A Exposure of other prosthetic materials into organ or tissue, initial encounter: Secondary | ICD-10-CM | POA: Diagnosis not present

## 2015-11-02 DIAGNOSIS — I10 Essential (primary) hypertension: Secondary | ICD-10-CM | POA: Diagnosis not present

## 2015-11-02 DIAGNOSIS — H10421 Simple chronic conjunctivitis, right eye: Secondary | ICD-10-CM | POA: Diagnosis not present

## 2015-11-21 DIAGNOSIS — Z79899 Other long term (current) drug therapy: Secondary | ICD-10-CM | POA: Diagnosis not present

## 2015-11-21 DIAGNOSIS — G902 Horner's syndrome: Secondary | ICD-10-CM | POA: Diagnosis not present

## 2015-11-21 MED FILL — ACYCLOVIR 800 MG TABLET: 800 | 30 days supply | Qty: 60 | Fill #0

## 2015-11-21 MED FILL — azaTHIOprine 50 MG TABS: 50 | 30 days supply | Qty: 180 | Fill #0

## 2015-12-08 DIAGNOSIS — I129 Hypertensive chronic kidney disease with stage 1 through stage 4 chronic kidney disease, or unspecified chronic kidney disease: Secondary | ICD-10-CM | POA: Diagnosis not present

## 2015-12-08 DIAGNOSIS — D631 Anemia in chronic kidney disease: Secondary | ICD-10-CM | POA: Diagnosis not present

## 2015-12-08 DIAGNOSIS — N183 Chronic kidney disease, stage 3 (moderate): Secondary | ICD-10-CM | POA: Diagnosis not present

## 2015-12-08 DIAGNOSIS — N2581 Secondary hyperparathyroidism of renal origin: Secondary | ICD-10-CM | POA: Diagnosis not present

## 2015-12-08 DIAGNOSIS — E669 Obesity, unspecified: Secondary | ICD-10-CM | POA: Diagnosis not present

## 2015-12-08 DIAGNOSIS — Z0000 Encounter for general adult medical examination without abnormal findings: Secondary | ICD-10-CM | POA: Diagnosis not present

## 2015-12-08 DIAGNOSIS — G902 Horner's syndrome: Secondary | ICD-10-CM | POA: Diagnosis not present

## 2015-12-13 DIAGNOSIS — T83729A Exposure of other prosthetic materials into organ or tissue, initial encounter: Secondary | ICD-10-CM | POA: Diagnosis not present

## 2015-12-13 DIAGNOSIS — H3581 Retinal edema: Secondary | ICD-10-CM | POA: Diagnosis not present

## 2015-12-13 DIAGNOSIS — Z961 Presence of intraocular lens: Secondary | ICD-10-CM | POA: Diagnosis not present

## 2015-12-13 DIAGNOSIS — H30033 Focal chorioretinal inflammation, peripheral, bilateral: Secondary | ICD-10-CM | POA: Diagnosis not present

## 2015-12-16 ENCOUNTER — Other Ambulatory Visit (HOSPITAL_COMMUNITY): Payer: Self-pay | Admitting: *Deleted

## 2015-12-19 ENCOUNTER — Ambulatory Visit (HOSPITAL_COMMUNITY)
Admission: RE | Admit: 2015-12-19 | Discharge: 2015-12-19 | Disposition: A | Discharge status: Home / Self Care | Payer: 59 | Source: Ambulatory Visit | Attending: Rheumatology | Admitting: Rheumatology

## 2015-12-19 DIAGNOSIS — H209 Unspecified iridocyclitis: Secondary | ICD-10-CM | POA: Diagnosis not present

## 2015-12-19 MED ORDER — SODIUM CHLORIDE 0.9 % IV SOLN
8.0000 mg/kg | INTRAVENOUS | Status: DC
  Administered 2015-12-19: 1000 mg via INTRAVENOUS
  Filled 2015-12-19: qty 100

## 2015-12-19 MED ORDER — SODIUM CHLORIDE 0.9 % IV SOLN
INTRAVENOUS | Status: DC
Start: 2015-12-19 — End: 2015-12-20
  Administered 2015-12-19: 09:00:00 via INTRAVENOUS

## 2015-12-19 MED ORDER — ACETAMINOPHEN 325 MG PO TABS: 650.0000 mg | ORAL_TABLET | ORAL | Status: DC

## 2015-12-22 MED FILL — azaTHIOprine 50 MG TABS: 50 | 30 days supply | Qty: 180 | Fill #1

## 2015-12-22 MED FILL — ACYCLOVIR 800 MG TABLET: 800 | 30 days supply | Qty: 60 | Fill #0

## 2016-01-19 MED FILL — azaTHIOprine 50 MG TABS: 50 | 30 days supply | Qty: 180 | Fill #0

## 2016-01-24 MED FILL — ACYCLOVIR 800 MG TABLET: 800 | 30 days supply | Qty: 60 | Fill #0

## 2016-02-02 ENCOUNTER — Other Ambulatory Visit: Payer: Self-pay

## 2016-02-02 NOTE — Patient Outreach (Signed)
[facility] referral for screening: Placed call to patient. No answer. Left a generic message requesting a call back.  PLAN: Will continue outreach efforts.  Rowe PavyAmanda Delorise Hunkele, RN, BSN, CEN [facility] Care Coordinator [phone]

## 2016-02-03 ENCOUNTER — Other Ambulatory Visit: Payer: Self-pay

## 2016-02-03 NOTE — Patient Outreach (Signed)
[facility] screening: Placed 2nd call to patient without an answer.   Plan: Will mail letter and attempt again.    Rowe PavyAmanda Aneliz Carbary, RN, BSN, CEN [facility] Care Coordinator [phone]

## 2016-02-08 DIAGNOSIS — T83729A Exposure of other prosthetic materials into organ or tissue, initial encounter: Secondary | ICD-10-CM | POA: Diagnosis not present

## 2016-02-08 DIAGNOSIS — T1511XD Foreign body in conjunctival sac, right eye, subsequent encounter: Secondary | ICD-10-CM | POA: Diagnosis not present

## 2016-02-08 DIAGNOSIS — H10421 Simple chronic conjunctivitis, right eye: Secondary | ICD-10-CM | POA: Diagnosis not present

## 2016-02-13 ENCOUNTER — Other Ambulatory Visit: Payer: Self-pay

## 2016-02-13 ENCOUNTER — Ambulatory Visit (HOSPITAL_COMMUNITY)
Admission: RE | Admit: 2016-02-13 | Discharge: 2016-02-13 | Disposition: A | Discharge status: Home / Self Care | Payer: 59 | Source: Ambulatory Visit | Attending: Internal Medicine | Admitting: Internal Medicine

## 2016-02-13 DIAGNOSIS — Z79899 Other long term (current) drug therapy: Secondary | ICD-10-CM | POA: Insufficient documentation

## 2016-02-13 DIAGNOSIS — H209 Unspecified iridocyclitis: Secondary | ICD-10-CM | POA: Diagnosis not present

## 2016-02-13 MED ORDER — SODIUM CHLORIDE 0.9 % IV SOLN
INTRAVENOUS | Status: DC
Start: 2016-02-13 — End: 2016-02-14
  Administered 2016-02-13: 09:00:00 via INTRAVENOUS

## 2016-02-13 MED ORDER — ACETAMINOPHEN 325 MG PO TABS: 650.0000 mg | ORAL_TABLET | ORAL | Status: DC

## 2016-02-13 MED ORDER — SODIUM CHLORIDE 0.9 % IV SOLN
8.0000 mg/kg | INTRAVENOUS | Status: DC
  Administered 2016-02-13: 1000 mg via INTRAVENOUS
  Filled 2016-02-13: qty 100

## 2016-02-13 NOTE — Patient Outreach (Signed)
[facility] screening: [facility]aced [facility] to [facility]. No answer. Mailed letter on 02/03/2016.  No answer.  PLAN: Will close case.  Rowe PavyAmanda Dequan Kindred, RN, BSN, CEN [facility] Care Coordinator [phone]

## 2016-02-17 ENCOUNTER — Other Ambulatory Visit: Payer: Self-pay

## 2016-02-17 NOTE — Patient Outreach (Signed)
[facility] screening: No response to 3 phone outreaches or a letter.  PLAN: Will close case as unable to reach.  Rowe PavyAmanda Dot Splinter, RN, BSN, CEN [facility] Care Coordinator [phone]

## 2016-02-21 MED FILL — ACYCLOVIR 800 MG TABLET: 800 | 30 days supply | Qty: 60 | Fill #0

## 2016-02-21 MED FILL — azaTHIOprine 50 MG TABS: 50 | 30 days supply | Qty: 180 | Fill #0

## 2016-03-20 MED FILL — PREDNISOLONE AC 1% EYE DROP: 1 | 90 days supply | Qty: 10 | Fill #0

## 2016-03-22 MED FILL — ACYCLOVIR 800 MG TABLET: 800 | 30 days supply | Qty: 60 | Fill #0

## 2016-03-22 MED FILL — azaTHIOprine 50 MG TABS: 50 | 30 days supply | Qty: 180 | Fill #1

## 2016-04-02 DIAGNOSIS — G902 Horner's syndrome: Secondary | ICD-10-CM | POA: Diagnosis not present

## 2016-04-02 DIAGNOSIS — Z79899 Other long term (current) drug therapy: Secondary | ICD-10-CM | POA: Diagnosis not present

## 2016-04-09 ENCOUNTER — Encounter (HOSPITAL_COMMUNITY): Payer: 59

## 2016-04-09 ENCOUNTER — Encounter (HOSPITAL_COMMUNITY)
Admission: RE | Admit: 2016-04-09 | Discharge: 2016-04-09 | Disposition: A | Discharge status: Home / Self Care | Payer: 59 | Source: Ambulatory Visit | Attending: Rheumatology | Admitting: Rheumatology

## 2016-04-09 DIAGNOSIS — H209 Unspecified iridocyclitis: Secondary | ICD-10-CM | POA: Diagnosis not present

## 2016-04-09 MED ORDER — ACETAMINOPHEN 325 MG PO TABS: 650.0000 mg | ORAL_TABLET | ORAL | Status: DC

## 2016-04-09 MED ORDER — SODIUM CHLORIDE 0.9 % IV SOLN
INTRAVENOUS | Status: DC
  Administered 2016-04-09: 08:00:00 via INTRAVENOUS

## 2016-04-09 MED ORDER — SODIUM CHLORIDE 0.9 % IV SOLN
8.0000 mg/kg | INTRAVENOUS | Status: DC
  Administered 2016-04-09: 1000 mg via INTRAVENOUS
  Filled 2016-04-09: qty 100

## 2016-04-24 DIAGNOSIS — H44112 Panuveitis, left eye: Secondary | ICD-10-CM | POA: Diagnosis not present

## 2016-04-24 DIAGNOSIS — Z961 Presence of intraocular lens: Secondary | ICD-10-CM | POA: Diagnosis not present

## 2016-04-24 DIAGNOSIS — H3581 Retinal edema: Secondary | ICD-10-CM | POA: Diagnosis not present

## 2016-04-24 DIAGNOSIS — Z79899 Other long term (current) drug therapy: Secondary | ICD-10-CM | POA: Diagnosis not present

## 2016-04-24 DIAGNOSIS — H30033 Focal chorioretinal inflammation, peripheral, bilateral: Secondary | ICD-10-CM | POA: Diagnosis not present

## 2016-04-24 DIAGNOSIS — H44113 Panuveitis, bilateral: Secondary | ICD-10-CM | POA: Insufficient documentation

## 2016-04-26 MED FILL — azaTHIOprine 50 MG TABS: 50 | 30 days supply | Qty: 180 | Fill #0

## 2016-04-26 MED FILL — ACYCLOVIR 800 MG TABLET: 800 | 30 days supply | Qty: 60 | Fill #0

## 2016-05-03 DIAGNOSIS — Z79899 Other long term (current) drug therapy: Secondary | ICD-10-CM | POA: Diagnosis not present

## 2016-05-30 MED FILL — ACYCLOVIR 800 MG TABLET: 800 | 30 days supply | Qty: 60 | Fill #0

## 2016-05-30 MED FILL — azaTHIOprine 50 MG TABS: 50 | 30 days supply | Qty: 180 | Fill #1

## 2016-05-30 MED FILL — SF 5000 PLUS CREAM: 1.1 | 30 days supply | Qty: 51 | Fill #0

## 2016-06-01 ENCOUNTER — Other Ambulatory Visit (HOSPITAL_COMMUNITY): Payer: Self-pay | Admitting: *Deleted

## 2016-06-04 ENCOUNTER — Ambulatory Visit (HOSPITAL_COMMUNITY)
Admission: RE | Admit: 2016-06-04 | Discharge: 2016-06-04 | Disposition: A | Discharge status: Home / Self Care | Payer: 59 | Source: Ambulatory Visit | Attending: Rheumatology | Admitting: Rheumatology

## 2016-06-04 DIAGNOSIS — H209 Unspecified iridocyclitis: Secondary | ICD-10-CM | POA: Insufficient documentation

## 2016-06-04 MED ORDER — ACETAMINOPHEN 325 MG PO TABS: 650.0000 mg | ORAL_TABLET | ORAL | Status: DC

## 2016-06-04 MED ORDER — SODIUM CHLORIDE 0.9 % IV SOLN
8.0000 mg/kg | INTRAVENOUS | Status: DC
Start: 2016-06-04 — End: 2016-06-05
  Administered 2016-06-04: 1000 mg via INTRAVENOUS
  Filled 2016-06-04: qty 10

## 2016-06-04 MED ORDER — SODIUM CHLORIDE 0.9 % IV SOLN
INTRAVENOUS | Status: DC
  Administered 2016-06-04: 09:00:00 via INTRAVENOUS

## 2016-06-29 MED FILL — ACYCLOVIR 800 MG TABLET: 800 | 30 days supply | Qty: 60 | Fill #0

## 2016-06-29 MED FILL — azaTHIOprine 50 MG TABS: 50 | 30 days supply | Qty: 180 | Fill #0

## 2016-07-18 DIAGNOSIS — H209 Unspecified iridocyclitis: Secondary | ICD-10-CM | POA: Diagnosis not present

## 2016-07-18 DIAGNOSIS — H44132 Sympathetic uveitis, left eye: Secondary | ICD-10-CM | POA: Diagnosis not present

## 2016-07-18 DIAGNOSIS — Z79899 Other long term (current) drug therapy: Secondary | ICD-10-CM | POA: Diagnosis not present

## 2016-07-27 ENCOUNTER — Other Ambulatory Visit (HOSPITAL_COMMUNITY): Payer: Self-pay | Admitting: *Deleted

## 2016-07-30 ENCOUNTER — Ambulatory Visit (HOSPITAL_COMMUNITY)
Admission: RE | Admit: 2016-07-30 | Discharge: 2016-07-30 | Disposition: A | Discharge status: Home / Self Care | Payer: 59 | Source: Ambulatory Visit | Attending: Rheumatology | Admitting: Rheumatology

## 2016-07-30 DIAGNOSIS — H209 Unspecified iridocyclitis: Secondary | ICD-10-CM | POA: Insufficient documentation

## 2016-07-30 MED ORDER — SODIUM CHLORIDE 0.9 % IV SOLN
INTRAVENOUS | Status: DC
  Administered 2016-07-30: 08:00:00 via INTRAVENOUS

## 2016-07-30 MED ORDER — SODIUM CHLORIDE 0.9 % IV SOLN
8.0000 mg/kg | INTRAVENOUS | Status: DC
  Administered 2016-07-30: 1000 mg via INTRAVENOUS
  Filled 2016-07-30: qty 20
  Filled 2016-07-30: qty 100

## 2016-07-30 MED ORDER — ACETAMINOPHEN 325 MG PO TABS: 650.0000 mg | ORAL_TABLET | ORAL | Status: DC

## 2016-07-31 DIAGNOSIS — H44133 Sympathetic uveitis, bilateral: Secondary | ICD-10-CM | POA: Diagnosis not present

## 2016-07-31 DIAGNOSIS — H3581 Retinal edema: Secondary | ICD-10-CM | POA: Diagnosis not present

## 2016-07-31 DIAGNOSIS — Z961 Presence of intraocular lens: Secondary | ICD-10-CM | POA: Diagnosis not present

## 2016-07-31 DIAGNOSIS — H30033 Focal chorioretinal inflammation, peripheral, bilateral: Secondary | ICD-10-CM | POA: Diagnosis not present

## 2016-07-31 MED FILL — ACYCLOVIR 800 MG TABLET: 800 | 30 days supply | Qty: 60 | Fill #0

## 2016-08-03 MED FILL — azaTHIOprine 50 MG TABS: 50 | 30 days supply | Qty: 180 | Fill #1

## 2016-08-14 MED FILL — PREDNISOLONE AC 1% EYE DROP: 1 | 90 days supply | Qty: 10 | Fill #1

## 2016-09-03 MED FILL — azaTHIOprine 50 MG TABS: 50 | 30 days supply | Qty: 180 | Fill #0

## 2016-09-03 MED FILL — ACYCLOVIR 800 MG TABLET: 800 | 30 days supply | Qty: 60 | Fill #0

## 2016-09-10 DIAGNOSIS — H44132 Sympathetic uveitis, left eye: Secondary | ICD-10-CM | POA: Diagnosis not present

## 2016-09-10 DIAGNOSIS — N189 Chronic kidney disease, unspecified: Secondary | ICD-10-CM | POA: Diagnosis not present

## 2016-09-10 DIAGNOSIS — E669 Obesity, unspecified: Secondary | ICD-10-CM | POA: Diagnosis not present

## 2016-09-10 DIAGNOSIS — D631 Anemia in chronic kidney disease: Secondary | ICD-10-CM | POA: Diagnosis not present

## 2016-09-10 DIAGNOSIS — G902 Horner's syndrome: Secondary | ICD-10-CM | POA: Diagnosis not present

## 2016-09-10 DIAGNOSIS — I129 Hypertensive chronic kidney disease with stage 1 through stage 4 chronic kidney disease, or unspecified chronic kidney disease: Secondary | ICD-10-CM | POA: Diagnosis not present

## 2016-09-10 DIAGNOSIS — N2581 Secondary hyperparathyroidism of renal origin: Secondary | ICD-10-CM | POA: Diagnosis not present

## 2016-09-10 DIAGNOSIS — N183 Chronic kidney disease, stage 3 (moderate): Secondary | ICD-10-CM | POA: Diagnosis not present

## 2016-09-17 DIAGNOSIS — Z79899 Other long term (current) drug therapy: Secondary | ICD-10-CM | POA: Diagnosis not present

## 2016-09-17 DIAGNOSIS — H209 Unspecified iridocyclitis: Secondary | ICD-10-CM | POA: Diagnosis not present

## 2016-09-17 DIAGNOSIS — H44132 Sympathetic uveitis, left eye: Secondary | ICD-10-CM | POA: Diagnosis not present

## 2016-09-24 ENCOUNTER — Ambulatory Visit (HOSPITAL_COMMUNITY)
Admission: RE | Admit: 2016-09-24 | Discharge: 2016-09-24 | Disposition: A | Discharge status: Home / Self Care | Payer: 59 | Source: Ambulatory Visit | Attending: Rheumatology | Admitting: Rheumatology

## 2016-09-24 DIAGNOSIS — H209 Unspecified iridocyclitis: Secondary | ICD-10-CM | POA: Diagnosis not present

## 2016-09-24 MED ORDER — SODIUM CHLORIDE 0.9 % IV SOLN
INTRAVENOUS | Status: DC
  Administered 2016-09-24: 09:00:00 via INTRAVENOUS

## 2016-09-24 MED ORDER — INFLIXIMAB 100 MG IV SOLR
8.0000 mg/kg | INTRAVENOUS | Status: DC
  Administered 2016-09-24: 1000 mg via INTRAVENOUS
  Filled 2016-09-24: qty 100

## 2016-09-24 MED ORDER — ACETAMINOPHEN 325 MG PO TABS: 650.0000 mg | ORAL_TABLET | ORAL | Status: DC

## 2016-10-03 MED FILL — azaTHIOprine 50 MG TABS: 50 | 30 days supply | Qty: 180 | Fill #1

## 2016-10-04 MED FILL — ACYCLOVIR 800 MG TABLET: 800 | 30 days supply | Qty: 60 | Fill #0

## 2016-10-12 ENCOUNTER — Ambulatory Visit (INDEPENDENT_AMBULATORY_CARE_PROVIDER_SITE_OTHER): Payer: 59 | Admitting: Podiatry

## 2016-10-12 ENCOUNTER — Ambulatory Visit (INDEPENDENT_AMBULATORY_CARE_PROVIDER_SITE_OTHER): Payer: 59

## 2016-10-12 ENCOUNTER — Other Ambulatory Visit: Payer: Self-pay | Admitting: *Deleted

## 2016-10-12 ENCOUNTER — Encounter: Payer: Self-pay | Admitting: Podiatry

## 2016-10-12 VITALS — BP 118/72 | HR 63 | Resp 16

## 2016-10-12 DIAGNOSIS — M2041 Other hammer toe(s) (acquired), right foot: Secondary | ICD-10-CM

## 2016-10-12 DIAGNOSIS — L84 Corns and callosities: Secondary | ICD-10-CM

## 2016-10-12 DIAGNOSIS — M79674 Pain in right toe(s): Secondary | ICD-10-CM | POA: Diagnosis not present

## 2016-10-12 NOTE — Progress Notes (Signed)
   Subjective:    Patient ID: Nicholas Schwalbeoshio Bustamante V., male    DOB: 04/13/1981, 36 y.o.   MRN: [id number]  HPI 36 year old male presents the office today for concerns of pain to his right fifth toe is been ongoing for several months. He states the areas painful pressure in shoes. He does go to the gym and he uses a StairMaster which puts pressure to his foot. He is in no recent treatment for this. Denies any recent injury or trauma. No swelling or redness or any drainage.   Review of Systems  All other systems reviewed and are negative.      Objective:   Physical Exam General: AAO x3, NAD  Dermatological: Hyperkeratotic lesions present the dorsal lateral aspect of the right fifth toe on the PIPJ. Upon debridement no underlying ulceration, drainage or any signs of infection. No other open lesions or pre-ulcerative lesions identified.  Vascular: Dorsalis Pedis artery and Posterior Tibial artery pedal pulses are 2/4 bilateral with immedate capillary fill time. There is no pain with calf compression, swelling, warmth, erythema.   Neruologic: Grossly intact via light touch bilateral. Vibratory intact via tuning fork bilateral. Protective threshold with Semmes Wienstein monofilament intact to all pedal sites bilateral.   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 There is hammertoe contractures present. 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 There is tenderness in the hyperkeratotic lesion to the dorsal lateral aspect of the fifth toe on the PIPJ. No other areas of tenderness bilaterally.  Gait: Unassisted, Nonantalgic.     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 Hammertoe contractures are present. Cavus foot type. No evidence  of acute fracture. -Hyperkeratotic lesion debrided without complications or bleeding. Offloading pads were dispensed. Discussed shoe gear modifications as well. Discussed possible surgical intervention for PIPJ arthroplasty in the future if symptoms continue. -Follow-up as needed   Ovid CurdMatthew Nicky Kras, DPM

## 2016-10-22 DIAGNOSIS — I10 Essential (primary) hypertension: Secondary | ICD-10-CM | POA: Diagnosis not present

## 2016-10-22 DIAGNOSIS — Z131 Encounter for screening for diabetes mellitus: Secondary | ICD-10-CM | POA: Diagnosis not present

## 2016-10-22 DIAGNOSIS — Z0000 Encounter for general adult medical examination without abnormal findings: Secondary | ICD-10-CM | POA: Diagnosis not present

## 2016-10-29 DIAGNOSIS — Z0000 Encounter for general adult medical examination without abnormal findings: Secondary | ICD-10-CM | POA: Diagnosis not present

## 2016-10-29 DIAGNOSIS — E782 Mixed hyperlipidemia: Secondary | ICD-10-CM | POA: Diagnosis not present

## 2016-10-29 DIAGNOSIS — G902 Horner's syndrome: Secondary | ICD-10-CM | POA: Diagnosis not present

## 2016-10-29 DIAGNOSIS — I10 Essential (primary) hypertension: Secondary | ICD-10-CM | POA: Diagnosis not present

## 2016-11-07 MED FILL — azaTHIOprine 50 MG TABS: 50 | 30 days supply | Qty: 180 | Fill #0

## 2016-11-07 MED FILL — ACYCLOVIR 800 MG TABLET: 800 | 30 days supply | Qty: 60 | Fill #0

## 2016-11-19 ENCOUNTER — Encounter (HOSPITAL_COMMUNITY): Payer: 59

## 2016-11-23 ENCOUNTER — Other Ambulatory Visit (HOSPITAL_COMMUNITY): Payer: Self-pay | Admitting: *Deleted

## 2016-11-26 ENCOUNTER — Ambulatory Visit (HOSPITAL_COMMUNITY)
Admission: RE | Admit: 2016-11-26 | Discharge: 2016-11-26 | Disposition: A | Discharge status: Home / Self Care | Payer: 59 | Source: Ambulatory Visit | Attending: Rheumatology | Admitting: Rheumatology

## 2016-11-26 DIAGNOSIS — H209 Unspecified iridocyclitis: Secondary | ICD-10-CM | POA: Insufficient documentation

## 2016-11-26 MED ORDER — SODIUM CHLORIDE 0.9 % IV SOLN: INTRAVENOUS | Status: DC

## 2016-11-26 MED ORDER — ACETAMINOPHEN 325 MG PO TABS: 650.0000 mg | ORAL_TABLET | ORAL | Status: DC

## 2016-11-26 MED ORDER — SODIUM CHLORIDE 0.9 % IV SOLN
8.0000 mg/kg | INTRAVENOUS | Status: DC
  Administered 2016-11-26: 900 mg via INTRAVENOUS
  Filled 2016-11-26: qty 90

## 2016-11-27 DIAGNOSIS — H3581 Retinal edema: Secondary | ICD-10-CM | POA: Diagnosis not present

## 2016-11-27 DIAGNOSIS — Z961 Presence of intraocular lens: Secondary | ICD-10-CM | POA: Diagnosis not present

## 2016-11-27 DIAGNOSIS — H30033 Focal chorioretinal inflammation, peripheral, bilateral: Secondary | ICD-10-CM | POA: Diagnosis not present

## 2016-11-27 DIAGNOSIS — H35372 Puckering of macula, left eye: Secondary | ICD-10-CM | POA: Diagnosis not present

## 2016-11-27 DIAGNOSIS — H44133 Sympathetic uveitis, bilateral: Secondary | ICD-10-CM | POA: Diagnosis not present

## 2016-12-06 MED FILL — ACYCLOVIR 800 MG TABLET: 800 | 30 days supply | Qty: 60 | Fill #0

## 2016-12-06 MED FILL — azaTHIOprine 50 MG TABS: 50 | 30 days supply | Qty: 180 | Fill #1

## 2016-12-17 DIAGNOSIS — H209 Unspecified iridocyclitis: Secondary | ICD-10-CM | POA: Diagnosis not present

## 2016-12-17 DIAGNOSIS — H44132 Sympathetic uveitis, left eye: Secondary | ICD-10-CM | POA: Diagnosis not present

## 2016-12-17 DIAGNOSIS — Z79899 Other long term (current) drug therapy: Secondary | ICD-10-CM | POA: Diagnosis not present

## 2017-01-03 MED FILL — ACYCLOVIR 800 MG TABLET: 800 | 30 days supply | Qty: 60 | Fill #0

## 2017-01-03 MED FILL — azaTHIOprine 50 MG TABS: 50 | 30 days supply | Qty: 180 | Fill #0

## 2017-01-21 ENCOUNTER — Ambulatory Visit (HOSPITAL_COMMUNITY)
Admission: RE | Admit: 2017-01-21 | Discharge: 2017-01-21 | Disposition: A | Discharge status: Home / Self Care | Payer: 59 | Source: Ambulatory Visit | Attending: Rheumatology | Admitting: Rheumatology

## 2017-01-21 DIAGNOSIS — H209 Unspecified iridocyclitis: Secondary | ICD-10-CM | POA: Insufficient documentation

## 2017-01-21 MED ORDER — SODIUM CHLORIDE 0.9 % IV SOLN
8.0000 mg/kg | INTRAVENOUS | Status: AC
  Administered 2017-01-21: 1000 mg via INTRAVENOUS
  Filled 2017-01-21: qty 100

## 2017-01-21 MED ORDER — SODIUM CHLORIDE 0.9 % IV SOLN
INTRAVENOUS | Status: AC
  Administered 2017-01-21: 09:00:00 via INTRAVENOUS

## 2017-01-21 MED ORDER — ACETAMINOPHEN 325 MG PO TABS: 650.0000 mg | ORAL_TABLET | ORAL | Status: DC

## 2017-02-08 MED FILL — azaTHIOprine 50 MG TABS: 50 | 30 days supply | Qty: 180 | Fill #1

## 2017-02-08 MED FILL — ACYCLOVIR 800 MG TABLET: 800 | 30 days supply | Qty: 60 | Fill #1

## 2017-03-14 MED FILL — ACYCLOVIR 800 MG TABLET: 800 | 30 days supply | Qty: 60 | Fill #0

## 2017-03-14 MED FILL — azaTHIOprine 50 MG TABS: 50 | 30 days supply | Qty: 180 | Fill #0

## 2017-03-15 ENCOUNTER — Other Ambulatory Visit (HOSPITAL_COMMUNITY): Payer: Self-pay | Admitting: *Deleted

## 2017-03-18 ENCOUNTER — Ambulatory Visit (HOSPITAL_COMMUNITY)
Admission: RE | Admit: 2017-03-18 | Discharge: 2017-03-18 | Disposition: A | Discharge status: Home / Self Care | Payer: 59 | Source: Ambulatory Visit | Attending: Rheumatology | Admitting: Rheumatology

## 2017-03-18 DIAGNOSIS — H209 Unspecified iridocyclitis: Secondary | ICD-10-CM | POA: Insufficient documentation

## 2017-03-18 MED ORDER — SODIUM CHLORIDE 0.9 % IV SOLN
INTRAVENOUS | Status: DC
  Administered 2017-03-18: 09:00:00 via INTRAVENOUS

## 2017-03-18 MED ORDER — ACETAMINOPHEN 325 MG PO TABS: 650.0000 mg | ORAL_TABLET | ORAL | Status: DC

## 2017-03-18 MED ORDER — SODIUM CHLORIDE 0.9 % IV SOLN
8.0000 mg/kg | INTRAVENOUS | Status: DC
  Administered 2017-03-18: 1000 mg via INTRAVENOUS
  Filled 2017-03-18 (×2): qty 100

## 2017-04-01 DIAGNOSIS — N183 Chronic kidney disease, stage 3 (moderate): Secondary | ICD-10-CM | POA: Diagnosis not present

## 2017-04-01 DIAGNOSIS — N189 Chronic kidney disease, unspecified: Secondary | ICD-10-CM | POA: Diagnosis not present

## 2017-04-01 DIAGNOSIS — E669 Obesity, unspecified: Secondary | ICD-10-CM | POA: Diagnosis not present

## 2017-04-02 DIAGNOSIS — H30033 Focal chorioretinal inflammation, peripheral, bilateral: Secondary | ICD-10-CM | POA: Diagnosis not present

## 2017-04-02 DIAGNOSIS — Z961 Presence of intraocular lens: Secondary | ICD-10-CM | POA: Diagnosis not present

## 2017-04-02 DIAGNOSIS — H44133 Sympathetic uveitis, bilateral: Secondary | ICD-10-CM | POA: Diagnosis not present

## 2017-04-02 DIAGNOSIS — H35372 Puckering of macula, left eye: Secondary | ICD-10-CM | POA: Diagnosis not present

## 2017-04-02 DIAGNOSIS — H3581 Retinal edema: Secondary | ICD-10-CM | POA: Diagnosis not present

## 2017-04-15 MED FILL — azaTHIOprine 50 MG TABS: 50 | 30 days supply | Qty: 180 | Fill #0

## 2017-04-15 MED FILL — SF 5000 PLUS CREAM: 1.1 | 30 days supply | Qty: 51 | Fill #1

## 2017-04-15 MED FILL — PREDNISOLONE AC 1% EYE DROP: 1 | 90 days supply | Qty: 10 | Fill #0

## 2017-04-18 DIAGNOSIS — H44132 Sympathetic uveitis, left eye: Secondary | ICD-10-CM | POA: Diagnosis not present

## 2017-04-18 DIAGNOSIS — Z79899 Other long term (current) drug therapy: Secondary | ICD-10-CM | POA: Diagnosis not present

## 2017-04-18 DIAGNOSIS — H209 Unspecified iridocyclitis: Secondary | ICD-10-CM | POA: Diagnosis not present

## 2017-04-25 DIAGNOSIS — N183 Chronic kidney disease, stage 3 (moderate): Secondary | ICD-10-CM | POA: Diagnosis not present

## 2017-04-26 DIAGNOSIS — H52223 Regular astigmatism, bilateral: Secondary | ICD-10-CM | POA: Diagnosis not present

## 2017-04-26 DIAGNOSIS — H47292 Other optic atrophy, left eye: Secondary | ICD-10-CM | POA: Diagnosis not present

## 2017-04-26 DIAGNOSIS — H5203 Hypermetropia, bilateral: Secondary | ICD-10-CM | POA: Diagnosis not present

## 2017-04-26 MED FILL — DOXYCYCLINE HYC 100 MG TAB: 100 | 8 days supply | Qty: 15 | Fill #0

## 2017-05-10 ENCOUNTER — Other Ambulatory Visit (HOSPITAL_COMMUNITY): Payer: Self-pay | Admitting: *Deleted

## 2017-05-13 ENCOUNTER — Ambulatory Visit (HOSPITAL_COMMUNITY)
Admission: RE | Admit: 2017-05-13 | Discharge: 2017-05-13 | Disposition: A | Discharge status: Home / Self Care | Payer: 59 | Source: Ambulatory Visit | Attending: Rheumatology | Admitting: Rheumatology

## 2017-05-13 DIAGNOSIS — H209 Unspecified iridocyclitis: Secondary | ICD-10-CM | POA: Diagnosis not present

## 2017-05-13 MED ORDER — ACETAMINOPHEN 325 MG PO TABS: 650.0000 mg | ORAL_TABLET | ORAL | Status: DC

## 2017-05-13 MED ORDER — SODIUM CHLORIDE 0.9 % IV SOLN: INTRAVENOUS | Status: DC

## 2017-05-13 MED ORDER — SODIUM CHLORIDE 0.9 % IV SOLN: 8.0000 mg/kg | INTRAVENOUS | Status: DC

## 2017-05-13 MED ORDER — SODIUM CHLORIDE 0.9 % IV SOLN
INTRAVENOUS | Status: DC
  Administered 2017-05-13: 09:00:00 via INTRAVENOUS

## 2017-05-13 MED ORDER — SODIUM CHLORIDE 0.9 % IV SOLN
8.0000 mg/kg | INTRAVENOUS | Status: AC
  Administered 2017-05-13: 900 mg via INTRAVENOUS
  Filled 2017-05-13: qty 90

## 2017-05-22 DIAGNOSIS — R319 Hematuria, unspecified: Secondary | ICD-10-CM | POA: Diagnosis not present

## 2017-05-22 MED FILL — azaTHIOprine 50 MG TABS: 50 | 30 days supply | Qty: 180 | Fill #0

## 2017-05-27 DIAGNOSIS — E782 Mixed hyperlipidemia: Secondary | ICD-10-CM | POA: Diagnosis not present

## 2017-06-03 DIAGNOSIS — G902 Horner's syndrome: Secondary | ICD-10-CM | POA: Diagnosis not present

## 2017-06-03 DIAGNOSIS — I10 Essential (primary) hypertension: Secondary | ICD-10-CM | POA: Diagnosis not present

## 2017-06-03 DIAGNOSIS — E782 Mixed hyperlipidemia: Secondary | ICD-10-CM | POA: Diagnosis not present

## 2017-06-03 DIAGNOSIS — E6609 Other obesity due to excess calories: Secondary | ICD-10-CM | POA: Diagnosis not present

## 2017-06-19 MED FILL — azaTHIOprine 50 MG TABS: 50 | 30 days supply | Qty: 180 | Fill #0

## 2017-07-08 ENCOUNTER — Ambulatory Visit (HOSPITAL_COMMUNITY)
Admission: RE | Admit: 2017-07-08 | Discharge: 2017-07-08 | Disposition: A | Discharge status: Home / Self Care | Payer: 59 | Source: Ambulatory Visit | Attending: Rheumatology | Admitting: Rheumatology

## 2017-07-08 DIAGNOSIS — H209 Unspecified iridocyclitis: Secondary | ICD-10-CM | POA: Insufficient documentation

## 2017-07-08 MED ORDER — ACETAMINOPHEN 325 MG PO TABS
650.0000 mg | ORAL_TABLET | ORAL | Status: DC
Start: 2017-07-08 — End: 2017-07-09

## 2017-07-08 MED ORDER — SODIUM CHLORIDE 0.9 % IV SOLN
8.0000 mg/kg | INTRAVENOUS | Status: DC
  Administered 2017-07-08: 1000 mg via INTRAVENOUS
  Filled 2017-07-08: qty 100

## 2017-07-08 MED ORDER — SODIUM CHLORIDE 0.9 % IV SOLN: INTRAVENOUS | Status: DC

## 2017-07-19 DIAGNOSIS — R3121 Asymptomatic microscopic hematuria: Secondary | ICD-10-CM | POA: Diagnosis not present

## 2017-07-26 MED FILL — azaTHIOprine 50 MG TABS: 50 | 30 days supply | Qty: 180 | Fill #0

## 2017-07-29 DIAGNOSIS — R319 Hematuria, unspecified: Secondary | ICD-10-CM | POA: Diagnosis not present

## 2017-07-29 DIAGNOSIS — H44132 Sympathetic uveitis, left eye: Secondary | ICD-10-CM | POA: Diagnosis not present

## 2017-07-29 DIAGNOSIS — H209 Unspecified iridocyclitis: Secondary | ICD-10-CM | POA: Diagnosis not present

## 2017-07-29 DIAGNOSIS — Z79899 Other long term (current) drug therapy: Secondary | ICD-10-CM | POA: Diagnosis not present

## 2017-07-31 DIAGNOSIS — R3121 Asymptomatic microscopic hematuria: Secondary | ICD-10-CM | POA: Diagnosis not present

## 2017-07-31 DIAGNOSIS — N4283 Cyst of prostate: Secondary | ICD-10-CM | POA: Diagnosis not present

## 2017-08-09 DIAGNOSIS — R3121 Asymptomatic microscopic hematuria: Secondary | ICD-10-CM | POA: Diagnosis not present

## 2017-08-20 DIAGNOSIS — Z961 Presence of intraocular lens: Secondary | ICD-10-CM | POA: Diagnosis not present

## 2017-08-20 DIAGNOSIS — Z79899 Other long term (current) drug therapy: Secondary | ICD-10-CM | POA: Diagnosis not present

## 2017-08-20 DIAGNOSIS — H44133 Sympathetic uveitis, bilateral: Secondary | ICD-10-CM | POA: Diagnosis not present

## 2017-08-20 DIAGNOSIS — H35372 Puckering of macula, left eye: Secondary | ICD-10-CM | POA: Diagnosis not present

## 2017-08-20 DIAGNOSIS — H30033 Focal chorioretinal inflammation, peripheral, bilateral: Secondary | ICD-10-CM | POA: Diagnosis not present

## 2017-08-20 DIAGNOSIS — Z970 Presence of artificial eye: Secondary | ICD-10-CM | POA: Diagnosis not present

## 2017-08-20 DIAGNOSIS — H3581 Retinal edema: Secondary | ICD-10-CM | POA: Diagnosis not present

## 2017-08-27 MED FILL — azaTHIOprine 50 MG TABS: 50 | 30 days supply | Qty: 180 | Fill #0

## 2017-08-30 ENCOUNTER — Other Ambulatory Visit (HOSPITAL_COMMUNITY): Payer: Self-pay

## 2017-09-02 ENCOUNTER — Ambulatory Visit (HOSPITAL_COMMUNITY)
Admission: RE | Admit: 2017-09-02 | Discharge: 2017-09-02 | Disposition: A | Discharge status: Home / Self Care | Payer: 59 | Source: Ambulatory Visit | Attending: Rheumatology | Admitting: Rheumatology

## 2017-09-02 DIAGNOSIS — H209 Unspecified iridocyclitis: Secondary | ICD-10-CM | POA: Insufficient documentation

## 2017-09-02 MED ORDER — ACETAMINOPHEN 325 MG PO TABS: 650.0000 mg | ORAL_TABLET | ORAL | Status: DC

## 2017-09-02 MED ORDER — SODIUM CHLORIDE 0.9 % IV SOLN
1000.0000 mg | INTRAVENOUS | Status: DC
  Administered 2017-09-02: 08:00:00 1000 mg via INTRAVENOUS
  Filled 2017-09-02: qty 100

## 2017-09-02 MED ORDER — SODIUM CHLORIDE 0.9 % IV SOLN
INTRAVENOUS | Status: DC
  Administered 2017-09-02: 08:00:00 via INTRAVENOUS

## 2017-10-02 MED FILL — azaTHIOprine 50 MG TABS: 50 | 30 days supply | Qty: 180 | Fill #0

## 2017-10-28 ENCOUNTER — Ambulatory Visit (HOSPITAL_COMMUNITY)
Admission: RE | Admit: 2017-10-28 | Discharge: 2017-10-28 | Disposition: A | Discharge status: Home / Self Care | Payer: 59 | Source: Ambulatory Visit | Attending: Rheumatology | Admitting: Rheumatology

## 2017-10-28 DIAGNOSIS — H209 Unspecified iridocyclitis: Secondary | ICD-10-CM | POA: Insufficient documentation

## 2017-10-28 MED ORDER — SODIUM CHLORIDE 0.9 % IV SOLN
INTRAVENOUS | Status: AC
  Administered 2017-10-28: 08:00:00 via INTRAVENOUS

## 2017-10-28 MED ORDER — ACETAMINOPHEN 325 MG PO TABS: 650.0000 mg | ORAL_TABLET | ORAL | Status: DC

## 2017-10-28 MED ORDER — SODIUM CHLORIDE 0.9 % IV SOLN
1000.0000 mg | INTRAVENOUS | Status: AC
  Administered 2017-10-28: 1000 mg via INTRAVENOUS
  Filled 2017-10-28: qty 100

## 2017-10-29 DIAGNOSIS — H209 Unspecified iridocyclitis: Secondary | ICD-10-CM | POA: Diagnosis not present

## 2017-10-29 DIAGNOSIS — H44132 Sympathetic uveitis, left eye: Secondary | ICD-10-CM | POA: Diagnosis not present

## 2017-10-29 DIAGNOSIS — R319 Hematuria, unspecified: Secondary | ICD-10-CM | POA: Diagnosis not present

## 2017-10-29 DIAGNOSIS — Z79899 Other long term (current) drug therapy: Secondary | ICD-10-CM | POA: Diagnosis not present

## 2017-11-07 MED FILL — azaTHIOprine 50 MG TABS: 50 | 30 days supply | Qty: 180 | Fill #0

## 2017-11-18 DIAGNOSIS — Z20828 Contact with and (suspected) exposure to other viral communicable diseases: Secondary | ICD-10-CM | POA: Diagnosis not present

## 2017-12-04 MED FILL — PREDNISOLONE AC 1% EYE DROP: 1 | 90 days supply | Qty: 10 | Fill #1

## 2017-12-10 DIAGNOSIS — E669 Obesity, unspecified: Secondary | ICD-10-CM | POA: Diagnosis not present

## 2017-12-10 DIAGNOSIS — G902 Horner's syndrome: Secondary | ICD-10-CM | POA: Diagnosis not present

## 2017-12-10 DIAGNOSIS — N2581 Secondary hyperparathyroidism of renal origin: Secondary | ICD-10-CM | POA: Diagnosis not present

## 2017-12-10 DIAGNOSIS — H44132 Sympathetic uveitis, left eye: Secondary | ICD-10-CM | POA: Diagnosis not present

## 2017-12-10 DIAGNOSIS — Z0000 Encounter for general adult medical examination without abnormal findings: Secondary | ICD-10-CM | POA: Diagnosis not present

## 2017-12-10 DIAGNOSIS — R319 Hematuria, unspecified: Secondary | ICD-10-CM | POA: Diagnosis not present

## 2017-12-10 DIAGNOSIS — N183 Chronic kidney disease, stage 3 (moderate): Secondary | ICD-10-CM | POA: Diagnosis not present

## 2017-12-10 DIAGNOSIS — I129 Hypertensive chronic kidney disease with stage 1 through stage 4 chronic kidney disease, or unspecified chronic kidney disease: Secondary | ICD-10-CM | POA: Diagnosis not present

## 2017-12-10 DIAGNOSIS — D631 Anemia in chronic kidney disease: Secondary | ICD-10-CM | POA: Diagnosis not present

## 2017-12-11 MED FILL — azaTHIOprine 50 MG TABS: 50 | 30 days supply | Qty: 180 | Fill #1

## 2017-12-20 ENCOUNTER — Other Ambulatory Visit (HOSPITAL_COMMUNITY): Payer: Self-pay | Admitting: *Deleted

## 2017-12-23 ENCOUNTER — Encounter (HOSPITAL_COMMUNITY)
Admission: RE | Admit: 2017-12-23 | Discharge: 2017-12-23 | Disposition: A | Discharge status: Home / Self Care | Payer: 59 | Source: Ambulatory Visit | Attending: Rheumatology | Admitting: Rheumatology

## 2017-12-23 DIAGNOSIS — Z131 Encounter for screening for diabetes mellitus: Secondary | ICD-10-CM | POA: Diagnosis not present

## 2017-12-23 DIAGNOSIS — H209 Unspecified iridocyclitis: Secondary | ICD-10-CM | POA: Insufficient documentation

## 2017-12-23 DIAGNOSIS — I10 Essential (primary) hypertension: Secondary | ICD-10-CM | POA: Diagnosis not present

## 2017-12-23 DIAGNOSIS — R319 Hematuria, unspecified: Secondary | ICD-10-CM | POA: Diagnosis not present

## 2017-12-23 DIAGNOSIS — N390 Urinary tract infection, site not specified: Secondary | ICD-10-CM | POA: Diagnosis not present

## 2017-12-23 MED ORDER — SODIUM CHLORIDE 0.9 % IV SOLN
8.0000 mg/kg | INTRAVENOUS | Status: DC
  Administered 2017-12-23: 1000 mg via INTRAVENOUS
  Filled 2017-12-23: qty 100

## 2017-12-23 MED ORDER — ACETAMINOPHEN 325 MG PO TABS: 650.0000 mg | ORAL_TABLET | ORAL | Status: DC

## 2017-12-24 DIAGNOSIS — H44133 Sympathetic uveitis, bilateral: Secondary | ICD-10-CM | POA: Diagnosis not present

## 2017-12-24 DIAGNOSIS — H30033 Focal chorioretinal inflammation, peripheral, bilateral: Secondary | ICD-10-CM | POA: Diagnosis not present

## 2017-12-24 DIAGNOSIS — H3581 Retinal edema: Secondary | ICD-10-CM | POA: Diagnosis not present

## 2017-12-24 DIAGNOSIS — H35372 Puckering of macula, left eye: Secondary | ICD-10-CM | POA: Diagnosis not present

## 2017-12-24 DIAGNOSIS — Z961 Presence of intraocular lens: Secondary | ICD-10-CM | POA: Diagnosis not present

## 2017-12-24 DIAGNOSIS — Z79899 Other long term (current) drug therapy: Secondary | ICD-10-CM | POA: Diagnosis not present

## 2017-12-30 DIAGNOSIS — I10 Essential (primary) hypertension: Secondary | ICD-10-CM | POA: Diagnosis not present

## 2017-12-30 DIAGNOSIS — Z0000 Encounter for general adult medical examination without abnormal findings: Secondary | ICD-10-CM | POA: Diagnosis not present

## 2017-12-30 DIAGNOSIS — R319 Hematuria, unspecified: Secondary | ICD-10-CM | POA: Diagnosis not present

## 2017-12-30 DIAGNOSIS — E6609 Other obesity due to excess calories: Secondary | ICD-10-CM | POA: Diagnosis not present

## 2017-12-30 DIAGNOSIS — E782 Mixed hyperlipidemia: Secondary | ICD-10-CM | POA: Diagnosis not present

## 2017-12-30 DIAGNOSIS — G902 Horner's syndrome: Secondary | ICD-10-CM | POA: Diagnosis not present

## 2017-12-30 DIAGNOSIS — B009 Herpesviral infection, unspecified: Secondary | ICD-10-CM | POA: Diagnosis not present

## 2017-12-30 MED FILL — VALACYCLOVIR HCL 500 MG TAB: 500 | 3 days supply | Qty: 6 | Fill #0

## 2018-01-13 MED FILL — azaTHIOprine 50 MG TABS: 50 | 30 days supply | Qty: 180 | Fill #2

## 2018-02-03 DIAGNOSIS — R3129 Other microscopic hematuria: Secondary | ICD-10-CM | POA: Diagnosis not present

## 2018-02-03 DIAGNOSIS — Z79899 Other long term (current) drug therapy: Secondary | ICD-10-CM | POA: Diagnosis not present

## 2018-02-03 DIAGNOSIS — H209 Unspecified iridocyclitis: Secondary | ICD-10-CM | POA: Diagnosis not present

## 2018-02-03 DIAGNOSIS — H44132 Sympathetic uveitis, left eye: Secondary | ICD-10-CM | POA: Diagnosis not present

## 2018-02-17 ENCOUNTER — Encounter (HOSPITAL_COMMUNITY)
Admission: RE | Admit: 2018-02-17 | Discharge: 2018-02-17 | Disposition: A | Discharge status: Home / Self Care | Payer: 59 | Source: Ambulatory Visit | Attending: Rheumatology | Admitting: Rheumatology

## 2018-02-17 DIAGNOSIS — H209 Unspecified iridocyclitis: Secondary | ICD-10-CM | POA: Insufficient documentation

## 2018-02-17 MED ORDER — ACETAMINOPHEN 325 MG PO TABS: 650.0000 mg | ORAL_TABLET | ORAL | Status: DC

## 2018-02-17 MED ORDER — SODIUM CHLORIDE 0.9 % IV SOLN
8.0000 mg/kg | INTRAVENOUS | Status: AC
  Administered 2018-02-17: 09:00:00 1000 mg via INTRAVENOUS
  Filled 2018-02-17: qty 100

## 2018-02-18 MED FILL — azaTHIOprine 50 MG TABS: 50 | 30 days supply | Qty: 180 | Fill #0

## 2018-02-18 MED FILL — PREDNISOLONE AC 1% EYE DROP: 1 | 90 days supply | Qty: 10 | Fill #2

## 2018-03-17 DIAGNOSIS — Z79899 Other long term (current) drug therapy: Secondary | ICD-10-CM | POA: Diagnosis not present

## 2018-03-17 DIAGNOSIS — H44132 Sympathetic uveitis, left eye: Secondary | ICD-10-CM | POA: Diagnosis not present

## 2018-03-24 MED FILL — azaTHIOprine 50 MG TABS: 50 | 30 days supply | Qty: 180 | Fill #0

## 2018-04-14 ENCOUNTER — Inpatient Hospital Stay (HOSPITAL_COMMUNITY): Admission: RE | Admit: 2018-04-14 | Payer: 59 | Source: Ambulatory Visit

## 2018-04-25 DIAGNOSIS — Z961 Presence of intraocular lens: Secondary | ICD-10-CM | POA: Diagnosis not present

## 2018-04-25 DIAGNOSIS — Z79899 Other long term (current) drug therapy: Secondary | ICD-10-CM | POA: Diagnosis not present

## 2018-04-25 DIAGNOSIS — H3581 Retinal edema: Secondary | ICD-10-CM | POA: Diagnosis not present

## 2018-04-25 DIAGNOSIS — H44133 Sympathetic uveitis, bilateral: Secondary | ICD-10-CM | POA: Diagnosis not present

## 2018-04-25 DIAGNOSIS — H35372 Puckering of macula, left eye: Secondary | ICD-10-CM | POA: Diagnosis not present

## 2018-04-25 DIAGNOSIS — H30033 Focal chorioretinal inflammation, peripheral, bilateral: Secondary | ICD-10-CM | POA: Diagnosis not present

## 2018-04-29 MED FILL — azaTHIOprine 50 MG TABS: 50 | 30 days supply | Qty: 180 | Fill #1

## 2018-05-05 DIAGNOSIS — R3129 Other microscopic hematuria: Secondary | ICD-10-CM | POA: Diagnosis not present

## 2018-05-05 DIAGNOSIS — Z79899 Other long term (current) drug therapy: Secondary | ICD-10-CM | POA: Diagnosis not present

## 2018-05-05 DIAGNOSIS — H44132 Sympathetic uveitis, left eye: Secondary | ICD-10-CM | POA: Diagnosis not present

## 2018-05-05 DIAGNOSIS — H209 Unspecified iridocyclitis: Secondary | ICD-10-CM | POA: Diagnosis not present

## 2018-05-23 ENCOUNTER — Other Ambulatory Visit (HOSPITAL_COMMUNITY): Payer: Self-pay | Admitting: *Deleted

## 2018-05-26 ENCOUNTER — Encounter (HOSPITAL_COMMUNITY)
Admission: RE | Admit: 2018-05-26 | Discharge: 2018-05-26 | Disposition: A | Discharge status: Home / Self Care | Payer: 59 | Source: Ambulatory Visit | Attending: Rheumatology | Admitting: Rheumatology

## 2018-05-26 DIAGNOSIS — H209 Unspecified iridocyclitis: Secondary | ICD-10-CM | POA: Diagnosis not present

## 2018-05-26 MED ORDER — SODIUM CHLORIDE 0.9 % IV SOLN
8.0000 mg/kg | INTRAVENOUS | Status: DC
  Administered 2018-05-26: 09:00:00 1000 mg via INTRAVENOUS
  Filled 2018-05-26: qty 100

## 2018-05-26 MED ORDER — SODIUM CHLORIDE 0.9 % IV SOLN
INTRAVENOUS | Status: DC
  Administered 2018-05-26: 09:00:00 via INTRAVENOUS

## 2018-05-26 MED ORDER — ACETAMINOPHEN 325 MG PO TABS: 650.0000 mg | ORAL_TABLET | ORAL | Status: DC

## 2018-06-03 MED FILL — azaTHIOprine 50 MG TABS: 50 | 30 days supply | Qty: 180 | Fill #2

## 2018-07-14 MED FILL — azaTHIOprine 50 MG TABS: 50 | 30 days supply | Qty: 180 | Fill #0

## 2018-07-15 DIAGNOSIS — Z0000 Encounter for general adult medical examination without abnormal findings: Secondary | ICD-10-CM | POA: Diagnosis not present

## 2018-07-15 DIAGNOSIS — I129 Hypertensive chronic kidney disease with stage 1 through stage 4 chronic kidney disease, or unspecified chronic kidney disease: Secondary | ICD-10-CM | POA: Diagnosis not present

## 2018-07-15 DIAGNOSIS — E669 Obesity, unspecified: Secondary | ICD-10-CM | POA: Diagnosis not present

## 2018-07-15 DIAGNOSIS — R319 Hematuria, unspecified: Secondary | ICD-10-CM | POA: Diagnosis not present

## 2018-07-15 DIAGNOSIS — H44132 Sympathetic uveitis, left eye: Secondary | ICD-10-CM | POA: Diagnosis not present

## 2018-07-15 DIAGNOSIS — N2581 Secondary hyperparathyroidism of renal origin: Secondary | ICD-10-CM | POA: Diagnosis not present

## 2018-07-15 DIAGNOSIS — G902 Horner's syndrome: Secondary | ICD-10-CM | POA: Diagnosis not present

## 2018-07-15 DIAGNOSIS — N183 Chronic kidney disease, stage 3 (moderate): Secondary | ICD-10-CM | POA: Diagnosis not present

## 2018-07-15 DIAGNOSIS — D631 Anemia in chronic kidney disease: Secondary | ICD-10-CM | POA: Diagnosis not present

## 2018-07-18 ENCOUNTER — Other Ambulatory Visit (HOSPITAL_COMMUNITY): Payer: Self-pay

## 2018-07-21 ENCOUNTER — Ambulatory Visit (HOSPITAL_COMMUNITY)
Admission: RE | Admit: 2018-07-21 | Discharge: 2018-07-21 | Disposition: A | Discharge status: Home / Self Care | Payer: 59 | Source: Ambulatory Visit | Attending: Rheumatology | Admitting: Rheumatology

## 2018-07-21 DIAGNOSIS — H209 Unspecified iridocyclitis: Secondary | ICD-10-CM | POA: Diagnosis not present

## 2018-07-21 MED ORDER — ACETAMINOPHEN 325 MG PO TABS
650.0000 mg | ORAL_TABLET | Freq: Once | ORAL | Status: DC
Start: 2018-07-21 — End: 2018-07-22

## 2018-07-21 MED ORDER — SODIUM CHLORIDE 0.9 % IV SOLN
8.0000 mg/kg | INTRAVENOUS | Status: DC
  Administered 2018-07-21: 10:00:00 1000 mg via INTRAVENOUS
  Filled 2018-07-21: qty 100

## 2018-07-23 LAB — QUANTIFERON-TB GOLD PLUS: QUANTIFERON-TB GOLD PLUS: NEGATIVE

## 2018-07-23 LAB — QUANTIFERON-TB GOLD PLUS (RQFGPL)
QUANTIFERON NIL VALUE: 0.04 [IU]/mL
QUANTIFERON TB1 AG VALUE: 0.04 [IU]/mL
QuantiFERON TB2 Ag Value: 0.03 IU/mL

## 2018-07-29 DIAGNOSIS — Z79899 Other long term (current) drug therapy: Secondary | ICD-10-CM | POA: Diagnosis not present

## 2018-07-29 DIAGNOSIS — H209 Unspecified iridocyclitis: Secondary | ICD-10-CM | POA: Diagnosis not present

## 2018-08-07 DIAGNOSIS — Z7189 Other specified counseling: Secondary | ICD-10-CM | POA: Diagnosis not present

## 2018-08-07 DIAGNOSIS — R3129 Other microscopic hematuria: Secondary | ICD-10-CM | POA: Diagnosis not present

## 2018-08-07 DIAGNOSIS — H209 Unspecified iridocyclitis: Secondary | ICD-10-CM | POA: Diagnosis not present

## 2018-08-07 DIAGNOSIS — Z79899 Other long term (current) drug therapy: Secondary | ICD-10-CM | POA: Diagnosis not present

## 2018-08-07 DIAGNOSIS — H44132 Sympathetic uveitis, left eye: Secondary | ICD-10-CM | POA: Diagnosis not present

## 2018-08-12 DIAGNOSIS — I129 Hypertensive chronic kidney disease with stage 1 through stage 4 chronic kidney disease, or unspecified chronic kidney disease: Secondary | ICD-10-CM | POA: Diagnosis not present

## 2018-08-14 MED FILL — azaTHIOprine 50 MG TABS: 50 | 30 days supply | Qty: 180 | Fill #1

## 2018-08-21 DIAGNOSIS — Z23 Encounter for immunization: Secondary | ICD-10-CM | POA: Diagnosis not present

## 2018-08-28 ENCOUNTER — Other Ambulatory Visit: Payer: Self-pay | Admitting: Podiatry

## 2018-08-28 ENCOUNTER — Ambulatory Visit (INDEPENDENT_AMBULATORY_CARE_PROVIDER_SITE_OTHER): Payer: 59

## 2018-08-28 ENCOUNTER — Ambulatory Visit (INDEPENDENT_AMBULATORY_CARE_PROVIDER_SITE_OTHER): Payer: 59 | Admitting: Podiatry

## 2018-08-28 DIAGNOSIS — L84 Corns and callosities: Secondary | ICD-10-CM | POA: Diagnosis not present

## 2018-08-28 DIAGNOSIS — M2041 Other hammer toe(s) (acquired), right foot: Secondary | ICD-10-CM

## 2018-08-28 DIAGNOSIS — M205X1 Other deformities of toe(s) (acquired), right foot: Secondary | ICD-10-CM

## 2018-08-28 DIAGNOSIS — M79671 Pain in right foot: Secondary | ICD-10-CM

## 2018-08-28 MED ORDER — MUPIROCIN 2 % EX OINT: 1.0000 "application " | TOPICAL_OINTMENT | Freq: Two times a day (BID) | CUTANEOUS | 2 refills | Status: DC

## 2018-08-28 MED FILL — MUPIROCIN 2% OINTMENT: 2 | 7 days supply | Qty: 22 | Fill #0

## 2018-09-01 NOTE — Progress Notes (Signed)
Subjective: 37 year old male presents the office today for concerns of pain to the right fifth toe.  He states that the callus has reformed he says the area trimmed.  He did well fracture in the last until recently it started to come back.  He denies any recent injury or trauma. Denies any systemic complaints such as fevers, chills, nausea, vomiting. No acute changes since last appointment, and no other complaints at this time.   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  Hyperkeratotic lesion on the dorsal lateral aspect of the fifth toe.  Upon debridement there is no underlying ulceration, drainage or any signs of infection noted today. No open lesions or pre-ulcerative lesions.  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  -X-rays were obtained and reviewed.  Adductovarus present of the toe.  No evidence of acute fracture. -I debrided the hyperkeratotic lesion without any complications or bleeding.  Dispensed offloading pads.  Also discussed surgical intervention in the future including PIPJ arthroplasty should symptoms continue. -Patient encouraged to call the office with any questions, concerns, change in symptoms.   Vivi BarrackMatthew R Tyra Gural DPM

## 2018-09-11 ENCOUNTER — Ambulatory Visit (INDEPENDENT_AMBULATORY_CARE_PROVIDER_SITE_OTHER): Payer: 59 | Admitting: Podiatry

## 2018-09-11 DIAGNOSIS — M205X1 Other deformities of toe(s) (acquired), right foot: Secondary | ICD-10-CM | POA: Diagnosis not present

## 2018-09-11 DIAGNOSIS — L84 Corns and callosities: Secondary | ICD-10-CM

## 2018-09-12 ENCOUNTER — Other Ambulatory Visit (HOSPITAL_COMMUNITY): Payer: Self-pay

## 2018-09-15 ENCOUNTER — Ambulatory Visit (HOSPITAL_COMMUNITY)
Admission: RE | Admit: 2018-09-15 | Discharge: 2018-09-15 | Disposition: A | Discharge status: Home / Self Care | Payer: 59 | Source: Ambulatory Visit | Attending: Rheumatology | Admitting: Rheumatology

## 2018-09-15 DIAGNOSIS — H209 Unspecified iridocyclitis: Secondary | ICD-10-CM | POA: Diagnosis not present

## 2018-09-15 MED ORDER — SODIUM CHLORIDE 0.9 % IV SOLN
8.0000 mg/kg | INTRAVENOUS | Status: DC
  Administered 2018-09-15: 09:00:00 1000 mg via INTRAVENOUS
  Filled 2018-09-15: qty 100

## 2018-09-15 MED ORDER — SODIUM CHLORIDE 0.9 % IV SOLN
INTRAVENOUS | Status: DC
  Administered 2018-09-15: 09:00:00 via INTRAVENOUS

## 2018-09-15 MED ORDER — ACETAMINOPHEN 325 MG PO TABS: 650.0000 mg | ORAL_TABLET | Freq: Once | ORAL | Status: DC

## 2018-09-17 MED FILL — azaTHIOprine 50 MG TABS: 50 | 30 days supply | Qty: 180 | Fill #2

## 2018-09-18 NOTE — Progress Notes (Signed)
Subjective: 37 year old male presents the office today for follow-up evaluation of pain to the right fifth toe.  Since I last saw him he states his pain is resolved he is no longer having any issues.  Denies any edema, erythema.  Is wearing regular shoes without any issues.  No new concerns.   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  There is minimal lesion on the dorsal lateral aspect of the fifth toe.  Upon debridement there is no underlying ulceration, drainage or any signs of infection noted today. No open lesions or pre-ulcerative lesions.  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  -I debrided some of the residual hyperkeratotic lesion without any complications or bleeding.  Continue offloading pads.  We should avoid putting surgical correction.  We will continue to monitor.-Patient encouraged to call the office with any questions, concerns, change in symptoms.   Vivi BarrackMatthew R Wagoner DPM

## 2018-10-20 DIAGNOSIS — Z79899 Other long term (current) drug therapy: Secondary | ICD-10-CM | POA: Diagnosis not present

## 2018-10-20 DIAGNOSIS — R3129 Other microscopic hematuria: Secondary | ICD-10-CM | POA: Diagnosis not present

## 2018-10-20 DIAGNOSIS — H44132 Sympathetic uveitis, left eye: Secondary | ICD-10-CM | POA: Diagnosis not present

## 2018-10-20 DIAGNOSIS — Z23 Encounter for immunization: Secondary | ICD-10-CM | POA: Diagnosis not present

## 2018-10-20 DIAGNOSIS — Z7189 Other specified counseling: Secondary | ICD-10-CM | POA: Diagnosis not present

## 2018-10-20 DIAGNOSIS — H209 Unspecified iridocyclitis: Secondary | ICD-10-CM | POA: Diagnosis not present

## 2018-10-22 MED FILL — azaTHIOprine 50 MG TABS: 50 | 30 days supply | Qty: 180 | Fill #0

## 2018-11-07 ENCOUNTER — Other Ambulatory Visit (HOSPITAL_COMMUNITY): Payer: Self-pay | Admitting: *Deleted

## 2018-11-10 ENCOUNTER — Encounter (HOSPITAL_COMMUNITY)
Admission: RE | Admit: 2018-11-10 | Discharge: 2018-11-10 | Disposition: A | Discharge status: Home / Self Care | Payer: 59 | Source: Ambulatory Visit | Attending: Rheumatology | Admitting: Rheumatology

## 2018-11-10 DIAGNOSIS — H209 Unspecified iridocyclitis: Secondary | ICD-10-CM | POA: Insufficient documentation

## 2018-11-10 MED ORDER — SODIUM CHLORIDE 0.9 % IV SOLN
8.0000 mg/kg | INTRAVENOUS | Status: DC
  Administered 2018-11-10: 09:00:00 1000 mg via INTRAVENOUS
  Filled 2018-11-10: qty 100

## 2018-11-10 MED ORDER — ACETAMINOPHEN 325 MG PO TABS: 650.0000 mg | ORAL_TABLET | ORAL | Status: DC

## 2018-11-11 MED FILL — PREDNISOLONE AC 1% EYE DROP: 1 | 75 days supply | Qty: 10 | Fill #0

## 2018-11-25 MED FILL — azaTHIOprine 50 MG TABS: 50 | 30 days supply | Qty: 180 | Fill #1

## 2018-12-02 DIAGNOSIS — H44133 Sympathetic uveitis, bilateral: Secondary | ICD-10-CM | POA: Diagnosis not present

## 2018-12-02 DIAGNOSIS — Z79899 Other long term (current) drug therapy: Secondary | ICD-10-CM | POA: Diagnosis not present

## 2018-12-02 DIAGNOSIS — Z961 Presence of intraocular lens: Secondary | ICD-10-CM | POA: Diagnosis not present

## 2018-12-02 DIAGNOSIS — H35372 Puckering of macula, left eye: Secondary | ICD-10-CM | POA: Diagnosis not present

## 2018-12-02 DIAGNOSIS — H30033 Focal chorioretinal inflammation, peripheral, bilateral: Secondary | ICD-10-CM | POA: Diagnosis not present

## 2018-12-02 DIAGNOSIS — H3581 Retinal edema: Secondary | ICD-10-CM | POA: Diagnosis not present

## 2018-12-31 MED FILL — azaTHIOprine 50 MG TABS: 50 | 30 days supply | Qty: 180 | Fill #0

## 2019-01-02 ENCOUNTER — Other Ambulatory Visit: Payer: Self-pay

## 2019-01-02 ENCOUNTER — Other Ambulatory Visit (HOSPITAL_COMMUNITY): Payer: Self-pay | Admitting: *Deleted

## 2019-01-05 ENCOUNTER — Other Ambulatory Visit: Payer: Self-pay

## 2019-01-05 ENCOUNTER — Encounter (HOSPITAL_COMMUNITY)
Admission: RE | Admit: 2019-01-05 | Discharge: 2019-01-05 | Disposition: A | Discharge status: Home / Self Care | Payer: 59 | Source: Ambulatory Visit | Attending: Rheumatology | Admitting: Rheumatology

## 2019-01-05 DIAGNOSIS — H209 Unspecified iridocyclitis: Secondary | ICD-10-CM | POA: Diagnosis not present

## 2019-01-05 MED ORDER — SODIUM CHLORIDE 0.9 % IV SOLN
8.0000 mg/kg | INTRAVENOUS | Status: DC
  Administered 2019-01-05: 1000 mg via INTRAVENOUS
  Filled 2019-01-05: qty 100

## 2019-01-05 MED ORDER — ACETAMINOPHEN 325 MG PO TABS
ORAL_TABLET | ORAL | Status: AC
  Filled 2019-01-05: qty 2

## 2019-01-05 MED ORDER — ACETAMINOPHEN 325 MG PO TABS: 650.0000 mg | ORAL_TABLET | ORAL | Status: DC

## 2019-01-23 DIAGNOSIS — R319 Hematuria, unspecified: Secondary | ICD-10-CM | POA: Diagnosis not present

## 2019-01-23 DIAGNOSIS — N2581 Secondary hyperparathyroidism of renal origin: Secondary | ICD-10-CM | POA: Diagnosis not present

## 2019-01-23 DIAGNOSIS — I129 Hypertensive chronic kidney disease with stage 1 through stage 4 chronic kidney disease, or unspecified chronic kidney disease: Secondary | ICD-10-CM | POA: Diagnosis not present

## 2019-01-23 DIAGNOSIS — D631 Anemia in chronic kidney disease: Secondary | ICD-10-CM | POA: Diagnosis not present

## 2019-01-23 DIAGNOSIS — H44132 Sympathetic uveitis, left eye: Secondary | ICD-10-CM | POA: Diagnosis not present

## 2019-01-23 DIAGNOSIS — G902 Horner's syndrome: Secondary | ICD-10-CM | POA: Diagnosis not present

## 2019-01-23 DIAGNOSIS — Z0000 Encounter for general adult medical examination without abnormal findings: Secondary | ICD-10-CM | POA: Diagnosis not present

## 2019-01-23 DIAGNOSIS — N183 Chronic kidney disease, stage 3 (moderate): Secondary | ICD-10-CM | POA: Diagnosis not present

## 2019-01-23 DIAGNOSIS — E669 Obesity, unspecified: Secondary | ICD-10-CM | POA: Diagnosis not present

## 2019-01-30 DIAGNOSIS — N183 Chronic kidney disease, stage 3 (moderate): Secondary | ICD-10-CM | POA: Diagnosis not present

## 2019-02-09 MED FILL — azaTHIOprine 50 MG TABS: 50 | 30 days supply | Qty: 180 | Fill #0

## 2019-02-09 MED FILL — PREDNISOLONE AC 1% EYE DROP: 1 | 75 days supply | Qty: 10 | Fill #1

## 2019-02-13 DIAGNOSIS — Z131 Encounter for screening for diabetes mellitus: Secondary | ICD-10-CM | POA: Diagnosis not present

## 2019-02-13 DIAGNOSIS — E782 Mixed hyperlipidemia: Secondary | ICD-10-CM | POA: Diagnosis not present

## 2019-02-13 DIAGNOSIS — I10 Essential (primary) hypertension: Secondary | ICD-10-CM | POA: Diagnosis not present

## 2019-02-13 DIAGNOSIS — Z0000 Encounter for general adult medical examination without abnormal findings: Secondary | ICD-10-CM | POA: Diagnosis not present

## 2019-02-20 DIAGNOSIS — I10 Essential (primary) hypertension: Secondary | ICD-10-CM | POA: Diagnosis not present

## 2019-02-20 DIAGNOSIS — B009 Herpesviral infection, unspecified: Secondary | ICD-10-CM | POA: Diagnosis not present

## 2019-02-20 DIAGNOSIS — E782 Mixed hyperlipidemia: Secondary | ICD-10-CM | POA: Diagnosis not present

## 2019-02-20 DIAGNOSIS — E6609 Other obesity due to excess calories: Secondary | ICD-10-CM | POA: Diagnosis not present

## 2019-02-20 DIAGNOSIS — G902 Horner's syndrome: Secondary | ICD-10-CM | POA: Diagnosis not present

## 2019-02-20 DIAGNOSIS — Z0000 Encounter for general adult medical examination without abnormal findings: Secondary | ICD-10-CM | POA: Diagnosis not present

## 2019-03-03 ENCOUNTER — Ambulatory Visit (HOSPITAL_COMMUNITY)
Admission: RE | Admit: 2019-03-03 | Discharge: 2019-03-03 | Disposition: A | Discharge status: Home / Self Care | Payer: 59 | Source: Ambulatory Visit | Attending: Rheumatology | Admitting: Rheumatology

## 2019-03-03 ENCOUNTER — Other Ambulatory Visit: Payer: Self-pay

## 2019-03-03 DIAGNOSIS — H209 Unspecified iridocyclitis: Secondary | ICD-10-CM | POA: Diagnosis not present

## 2019-03-03 MED ORDER — ACETAMINOPHEN 325 MG PO TABS: 650.0000 mg | ORAL_TABLET | ORAL | Status: DC

## 2019-03-03 MED ORDER — SODIUM CHLORIDE 0.9 % IV SOLN
8.0000 mg/kg | INTRAVENOUS | Status: DC
  Administered 2019-03-03: 1000 mg via INTRAVENOUS
  Filled 2019-03-03: qty 100

## 2019-03-12 MED FILL — azaTHIOprine 50 MG TABS: 50 | 30 days supply | Qty: 180 | Fill #0

## 2019-03-20 DIAGNOSIS — H44132 Sympathetic uveitis, left eye: Secondary | ICD-10-CM | POA: Diagnosis not present

## 2019-03-20 DIAGNOSIS — Z79899 Other long term (current) drug therapy: Secondary | ICD-10-CM | POA: Diagnosis not present

## 2019-03-20 DIAGNOSIS — H209 Unspecified iridocyclitis: Secondary | ICD-10-CM | POA: Diagnosis not present

## 2019-03-20 DIAGNOSIS — R3129 Other microscopic hematuria: Secondary | ICD-10-CM | POA: Diagnosis not present

## 2019-04-14 MED FILL — azaTHIOprine 50 MG TABS: 50 | 30 days supply | Qty: 180 | Fill #0

## 2019-04-17 DIAGNOSIS — H209 Unspecified iridocyclitis: Secondary | ICD-10-CM | POA: Diagnosis not present

## 2019-04-28 ENCOUNTER — Other Ambulatory Visit: Payer: Self-pay

## 2019-04-28 ENCOUNTER — Ambulatory Visit (HOSPITAL_COMMUNITY)
Admission: RE | Admit: 2019-04-28 | Discharge: 2019-04-28 | Disposition: A | Discharge status: Home / Self Care | Payer: 59 | Source: Ambulatory Visit | Attending: Rheumatology | Admitting: Rheumatology

## 2019-04-28 DIAGNOSIS — H209 Unspecified iridocyclitis: Secondary | ICD-10-CM | POA: Diagnosis not present

## 2019-04-28 MED ORDER — SODIUM CHLORIDE 0.9 % IV SOLN
8.0000 mg/kg | INTRAVENOUS | Status: DC
  Administered 2019-04-28: 1000 mg via INTRAVENOUS
  Filled 2019-04-28: qty 100

## 2019-04-28 MED ORDER — ACETAMINOPHEN 325 MG PO TABS: 650.0000 mg | ORAL_TABLET | ORAL | Status: DC

## 2019-05-20 MED FILL — AZATHIOPRINE 50 MG TABS: 50 | 30 days supply | Qty: 180 | Fill #0

## 2019-06-02 DIAGNOSIS — H35372 Puckering of macula, left eye: Secondary | ICD-10-CM | POA: Diagnosis not present

## 2019-06-02 DIAGNOSIS — Z79899 Other long term (current) drug therapy: Secondary | ICD-10-CM | POA: Diagnosis not present

## 2019-06-02 DIAGNOSIS — Z961 Presence of intraocular lens: Secondary | ICD-10-CM | POA: Diagnosis not present

## 2019-06-02 DIAGNOSIS — H3581 Retinal edema: Secondary | ICD-10-CM | POA: Diagnosis not present

## 2019-06-02 DIAGNOSIS — H30033 Focal chorioretinal inflammation, peripheral, bilateral: Secondary | ICD-10-CM | POA: Diagnosis not present

## 2019-06-02 DIAGNOSIS — H44133 Sympathetic uveitis, bilateral: Secondary | ICD-10-CM | POA: Diagnosis not present

## 2019-06-02 MED FILL — PREDNISOLONE AC 1% EYE DROP: 1 | 90 days supply | Qty: 10 | Fill #0

## 2019-06-23 ENCOUNTER — Encounter (HOSPITAL_COMMUNITY): Payer: 59

## 2019-06-25 MED FILL — AZATHIOPRINE 50 MG TABS: 50 | 30 days supply | Qty: 180 | Fill #0

## 2019-07-02 ENCOUNTER — Ambulatory Visit (HOSPITAL_COMMUNITY)
Admission: RE | Admit: 2019-07-02 | Discharge: 2019-07-02 | Disposition: A | Discharge status: Home / Self Care | Payer: 59 | Source: Ambulatory Visit | Attending: Rheumatology | Admitting: Rheumatology

## 2019-07-02 ENCOUNTER — Other Ambulatory Visit: Payer: Self-pay

## 2019-07-02 DIAGNOSIS — E669 Obesity, unspecified: Secondary | ICD-10-CM | POA: Diagnosis not present

## 2019-07-02 DIAGNOSIS — H209 Unspecified iridocyclitis: Secondary | ICD-10-CM | POA: Insufficient documentation

## 2019-07-02 DIAGNOSIS — I129 Hypertensive chronic kidney disease with stage 1 through stage 4 chronic kidney disease, or unspecified chronic kidney disease: Secondary | ICD-10-CM | POA: Diagnosis not present

## 2019-07-02 DIAGNOSIS — H44132 Sympathetic uveitis, left eye: Secondary | ICD-10-CM | POA: Diagnosis not present

## 2019-07-02 DIAGNOSIS — N183 Chronic kidney disease, stage 3 (moderate): Secondary | ICD-10-CM | POA: Diagnosis not present

## 2019-07-02 DIAGNOSIS — D631 Anemia in chronic kidney disease: Secondary | ICD-10-CM | POA: Diagnosis not present

## 2019-07-02 DIAGNOSIS — N189 Chronic kidney disease, unspecified: Secondary | ICD-10-CM | POA: Diagnosis not present

## 2019-07-02 DIAGNOSIS — N2581 Secondary hyperparathyroidism of renal origin: Secondary | ICD-10-CM | POA: Diagnosis not present

## 2019-07-02 MED ORDER — SODIUM CHLORIDE 0.9 % IV SOLN
8.0000 mg/kg | INTRAVENOUS | Status: DC
  Administered 2019-07-02: 09:00:00 1000 mg via INTRAVENOUS
  Filled 2019-07-02: qty 100

## 2019-07-02 MED ORDER — ACETAMINOPHEN 325 MG PO TABS: 650.0000 mg | ORAL_TABLET | ORAL | Status: DC

## 2019-07-03 DIAGNOSIS — I10 Essential (primary) hypertension: Secondary | ICD-10-CM | POA: Diagnosis not present

## 2019-07-03 DIAGNOSIS — E782 Mixed hyperlipidemia: Secondary | ICD-10-CM | POA: Diagnosis not present

## 2019-07-14 DIAGNOSIS — H44132 Sympathetic uveitis, left eye: Secondary | ICD-10-CM | POA: Diagnosis not present

## 2019-07-14 DIAGNOSIS — R3129 Other microscopic hematuria: Secondary | ICD-10-CM | POA: Diagnosis not present

## 2019-07-14 DIAGNOSIS — H209 Unspecified iridocyclitis: Secondary | ICD-10-CM | POA: Diagnosis not present

## 2019-07-14 DIAGNOSIS — Z79899 Other long term (current) drug therapy: Secondary | ICD-10-CM | POA: Diagnosis not present

## 2019-07-15 DIAGNOSIS — Z7189 Other specified counseling: Secondary | ICD-10-CM | POA: Diagnosis not present

## 2019-07-15 DIAGNOSIS — I10 Essential (primary) hypertension: Secondary | ICD-10-CM | POA: Diagnosis not present

## 2019-07-15 DIAGNOSIS — E782 Mixed hyperlipidemia: Secondary | ICD-10-CM | POA: Diagnosis not present

## 2019-07-15 DIAGNOSIS — Z0000 Encounter for general adult medical examination without abnormal findings: Secondary | ICD-10-CM | POA: Diagnosis not present

## 2019-07-15 DIAGNOSIS — F39 Unspecified mood [affective] disorder: Secondary | ICD-10-CM | POA: Diagnosis not present

## 2019-07-15 DIAGNOSIS — G902 Horner's syndrome: Secondary | ICD-10-CM | POA: Diagnosis not present

## 2019-07-28 MED FILL — azaTHIOprine 50 MG TABS: 50 | 30 days supply | Qty: 180 | Fill #0

## 2019-07-31 MED FILL — PREDNISOLONE AC 1% EYE DROP: 1 | 90 days supply | Qty: 10 | Fill #0

## 2019-08-27 ENCOUNTER — Encounter (HOSPITAL_COMMUNITY): Payer: 59

## 2019-08-28 ENCOUNTER — Other Ambulatory Visit (HOSPITAL_COMMUNITY): Payer: Self-pay | Admitting: *Deleted

## 2019-08-31 ENCOUNTER — Other Ambulatory Visit: Payer: Self-pay

## 2019-08-31 ENCOUNTER — Ambulatory Visit (HOSPITAL_COMMUNITY)
Admission: RE | Admit: 2019-08-31 | Discharge: 2019-08-31 | Disposition: A | Discharge status: Home / Self Care | Payer: 59 | Source: Ambulatory Visit | Attending: Rheumatology | Admitting: Rheumatology

## 2019-08-31 DIAGNOSIS — H209 Unspecified iridocyclitis: Secondary | ICD-10-CM | POA: Diagnosis not present

## 2019-08-31 MED ORDER — ACETAMINOPHEN 325 MG PO TABS: 650.0000 mg | ORAL_TABLET | ORAL | Status: DC

## 2019-08-31 MED ORDER — SODIUM CHLORIDE 0.9 % IV SOLN
8.0000 mg/kg | INTRAVENOUS | Status: DC
  Administered 2019-08-31: 1000 mg via INTRAVENOUS
  Filled 2019-08-31: qty 100

## 2019-09-01 MED FILL — AZATHIOPRINE 50 MG TABS: 50 | 30 days supply | Qty: 180 | Fill #0

## 2019-09-02 MED FILL — OMRON 3 SERIES BP MONITOR D: 20 days supply | Qty: 1 | Fill #0

## 2019-10-12 MED FILL — AZATHIOPRINE 50 MG TABS: 50 | 30 days supply | Qty: 180 | Fill #0

## 2019-10-14 DIAGNOSIS — H44132 Sympathetic uveitis, left eye: Secondary | ICD-10-CM | POA: Diagnosis not present

## 2019-10-14 DIAGNOSIS — R3129 Other microscopic hematuria: Secondary | ICD-10-CM | POA: Diagnosis not present

## 2019-10-14 DIAGNOSIS — H209 Unspecified iridocyclitis: Secondary | ICD-10-CM | POA: Diagnosis not present

## 2019-10-14 DIAGNOSIS — E6609 Other obesity due to excess calories: Secondary | ICD-10-CM | POA: Diagnosis not present

## 2019-10-14 DIAGNOSIS — Z79899 Other long term (current) drug therapy: Secondary | ICD-10-CM | POA: Diagnosis not present

## 2019-10-26 ENCOUNTER — Other Ambulatory Visit: Payer: Self-pay

## 2019-10-26 ENCOUNTER — Ambulatory Visit (HOSPITAL_COMMUNITY)
Admission: RE | Admit: 2019-10-26 | Discharge: 2019-10-26 | Disposition: A | Discharge status: Home / Self Care | Payer: 59 | Source: Ambulatory Visit | Attending: Rheumatology | Admitting: Rheumatology

## 2019-10-26 DIAGNOSIS — H209 Unspecified iridocyclitis: Secondary | ICD-10-CM | POA: Insufficient documentation

## 2019-10-26 MED ORDER — ACETAMINOPHEN 325 MG PO TABS: 650.0000 mg | ORAL_TABLET | ORAL | Status: DC

## 2019-10-26 MED ORDER — SODIUM CHLORIDE 0.9 % IV SOLN
8.0000 mg/kg | INTRAVENOUS | Status: DC
  Administered 2019-10-26: 1100 mg via INTRAVENOUS
  Filled 2019-10-26: qty 110

## 2019-11-04 DIAGNOSIS — Z79899 Other long term (current) drug therapy: Secondary | ICD-10-CM | POA: Diagnosis not present

## 2019-11-04 DIAGNOSIS — R3129 Other microscopic hematuria: Secondary | ICD-10-CM | POA: Diagnosis not present

## 2019-11-04 DIAGNOSIS — H44132 Sympathetic uveitis, left eye: Secondary | ICD-10-CM | POA: Diagnosis not present

## 2019-11-04 DIAGNOSIS — E6609 Other obesity due to excess calories: Secondary | ICD-10-CM | POA: Diagnosis not present

## 2019-11-04 DIAGNOSIS — H209 Unspecified iridocyclitis: Secondary | ICD-10-CM | POA: Diagnosis not present

## 2019-11-04 DIAGNOSIS — B029 Zoster without complications: Secondary | ICD-10-CM | POA: Diagnosis not present

## 2019-11-04 MED FILL — valACYclovir HCL 1 GM TABS: 1 | 7 days supply | Qty: 21 | Fill #0

## 2019-11-16 MED FILL — AZATHIOPRINE 50 MG TABS: 50 | 30 days supply | Qty: 180 | Fill #0

## 2019-11-21 DIAGNOSIS — H5202 Hypermetropia, left eye: Secondary | ICD-10-CM | POA: Diagnosis not present

## 2019-11-21 DIAGNOSIS — H52222 Regular astigmatism, left eye: Secondary | ICD-10-CM | POA: Diagnosis not present

## 2019-11-21 DIAGNOSIS — H4042X3 Glaucoma secondary to eye inflammation, left eye, severe stage: Secondary | ICD-10-CM | POA: Diagnosis not present

## 2019-12-08 DIAGNOSIS — H35372 Puckering of macula, left eye: Secondary | ICD-10-CM | POA: Diagnosis not present

## 2019-12-08 DIAGNOSIS — Z79899 Other long term (current) drug therapy: Secondary | ICD-10-CM | POA: Diagnosis not present

## 2019-12-08 DIAGNOSIS — H30033 Focal chorioretinal inflammation, peripheral, bilateral: Secondary | ICD-10-CM | POA: Diagnosis not present

## 2019-12-08 DIAGNOSIS — H44133 Sympathetic uveitis, bilateral: Secondary | ICD-10-CM | POA: Diagnosis not present

## 2019-12-08 DIAGNOSIS — Z961 Presence of intraocular lens: Secondary | ICD-10-CM | POA: Diagnosis not present

## 2019-12-08 DIAGNOSIS — H3581 Retinal edema: Secondary | ICD-10-CM | POA: Diagnosis not present

## 2019-12-11 MED FILL — PREDNISOLONE AC 1% EYE DROP: 1 | 90 days supply | Qty: 10 | Fill #1

## 2019-12-21 ENCOUNTER — Inpatient Hospital Stay (HOSPITAL_COMMUNITY): Admission: RE | Admit: 2019-12-21 | Payer: 59 | Source: Ambulatory Visit

## 2019-12-23 MED FILL — AZATHIOPRINE 50 MG TABS: 50 | 30 days supply | Qty: 180 | Fill #0

## 2020-01-01 ENCOUNTER — Ambulatory Visit: Payer: 59 | Attending: Internal Medicine

## 2020-01-01 DIAGNOSIS — Z23 Encounter for immunization: Secondary | ICD-10-CM

## 2020-01-01 NOTE — Progress Notes (Signed)
   Covid-19 Vaccination Clinic  Name:  Tian Mcmurtrey    MRN: [id number] DOB: 1981-09-13  01/01/2020  Mr. Koeneman was observed post Covid-19 immunization for 15 minutes without incident. He was provided with Vaccine Information Sheet and instruction to access the V-Safe system.   Mr. Sagraves was instructed to call 911 with any severe reactions post vaccine: Marland Kitchen Difficulty breathing  . Swelling of face and throat  . A fast heartbeat  . A bad rash all over body  . Dizziness and weakness   Immunizations Administered    Name Date Dose VIS Date Route   Pfizer COVID-19 Vaccine 01/01/2020  3:40 PM 0.3 mL 09/18/2019 Intramuscular   Manufacturer: [employer], [employer]   Lot: [id number]   NDC: [address]-4

## 2020-01-12 DIAGNOSIS — H209 Unspecified iridocyclitis: Secondary | ICD-10-CM | POA: Diagnosis not present

## 2020-01-12 DIAGNOSIS — H44132 Sympathetic uveitis, left eye: Secondary | ICD-10-CM | POA: Diagnosis not present

## 2020-01-12 DIAGNOSIS — R3129 Other microscopic hematuria: Secondary | ICD-10-CM | POA: Diagnosis not present

## 2020-01-12 DIAGNOSIS — E6609 Other obesity due to excess calories: Secondary | ICD-10-CM | POA: Diagnosis not present

## 2020-01-12 DIAGNOSIS — Z79899 Other long term (current) drug therapy: Secondary | ICD-10-CM | POA: Diagnosis not present

## 2020-01-16 MED FILL — azaTHIOprine 50 MG TABS: 50 | 30 days supply | Qty: 180 | Fill #0

## 2020-01-25 DIAGNOSIS — N189 Chronic kidney disease, unspecified: Secondary | ICD-10-CM | POA: Diagnosis not present

## 2020-01-25 DIAGNOSIS — I129 Hypertensive chronic kidney disease with stage 1 through stage 4 chronic kidney disease, or unspecified chronic kidney disease: Secondary | ICD-10-CM | POA: Diagnosis not present

## 2020-01-25 DIAGNOSIS — E669 Obesity, unspecified: Secondary | ICD-10-CM | POA: Diagnosis not present

## 2020-01-25 DIAGNOSIS — N2581 Secondary hyperparathyroidism of renal origin: Secondary | ICD-10-CM | POA: Diagnosis not present

## 2020-01-25 DIAGNOSIS — D631 Anemia in chronic kidney disease: Secondary | ICD-10-CM | POA: Diagnosis not present

## 2020-01-25 DIAGNOSIS — H44132 Sympathetic uveitis, left eye: Secondary | ICD-10-CM | POA: Diagnosis not present

## 2020-01-25 DIAGNOSIS — R319 Hematuria, unspecified: Secondary | ICD-10-CM | POA: Diagnosis not present

## 2020-01-25 DIAGNOSIS — N183 Chronic kidney disease, stage 3 unspecified: Secondary | ICD-10-CM | POA: Diagnosis not present

## 2020-01-25 MED FILL — AMLODIPINE BESYLATE 5 MG TA: 5 | 31 days supply | Qty: 31 | Fill #0

## 2020-01-26 ENCOUNTER — Other Ambulatory Visit (HOSPITAL_COMMUNITY): Payer: Self-pay | Admitting: Internal Medicine

## 2020-01-26 ENCOUNTER — Ambulatory Visit (HOSPITAL_BASED_OUTPATIENT_CLINIC_OR_DEPARTMENT_OTHER): Payer: 59 | Admitting: Pharmacist

## 2020-01-26 ENCOUNTER — Ambulatory Visit: Payer: 59 | Attending: Internal Medicine

## 2020-01-26 ENCOUNTER — Other Ambulatory Visit: Payer: Self-pay

## 2020-01-26 ENCOUNTER — Encounter: Payer: Self-pay | Admitting: Pharmacist

## 2020-01-26 DIAGNOSIS — Z23 Encounter for immunization: Secondary | ICD-10-CM

## 2020-01-26 DIAGNOSIS — Z79899 Other long term (current) drug therapy: Secondary | ICD-10-CM

## 2020-01-26 MED ORDER — REMICADE 100 MG IV SOLR: INTRAVENOUS | 99 refills | Status: AC

## 2020-01-26 NOTE — Progress Notes (Signed)
  S: Patient presents for review of their specialty medication therapy.  Patient is currently taking Remicade for uveitis. Patient is managed by Dr. Deanne Coffer for this.   Adherence: confirms  Efficacy: reports taking this ~10 years. He is doing well on this.   Dosing: 1120 mg dose (8 mg/kg)   Screening: TB test: completed; negative per pt Hepatitis: completed, negative per pt  Monitoring: S/sx of infection: none CBC: WNL - had blood work completed at the beginning of this month and was deemed stable by Dr. Deanne Coffer.  S/sx of hypersensitivity: none  S/sx of malignancy: none  S/sx of heart failure: none   Other side effects: none   O:   Lab Results  Component Value Date   WBC 4.8 07/04/2015   HGB 15.0 07/04/2015   HCT 46.0 07/04/2015   MCV 87.5 07/04/2015   PLT 226 07/04/2015      Chemistry      Component Value Date/Time   NA 142 07/04/2015 1418   K 4.0 07/04/2015 1418   CL 106 07/04/2015 1418   CO2 27 07/04/2015 1418   BUN 20 07/04/2015 1418   CREATININE 1.54 (H) 07/04/2015 1418      Component Value Date/Time   CALCIUM 9.4 07/04/2015 1418   ALKPHOS 67 07/04/2015 1418   AST 36 07/04/2015 1418   ALT 26 07/04/2015 1418   BILITOT 0.7 07/04/2015 1418       A/P: 1. 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 Reviewed the medication with the patient, including the following: Remicade is a TNF blocking agent indicated for ankylosing spondylitis, Crohn's disease, psoriatic arthritis, plaque psoriasis, and ulcerative colitis. Use in caution in patients with seizure or CNS demyelinating disorders. Patient educated on purpose, proper use and potential adverse effects of Remicade. Possible adverse effects are increased risk of infections, headache, and injection site reactions. There is the possibility of an increased risk of malignancy but it is not well understood if this increased risk is due to there medication or the disease state. There are rare cases of  pancytopenia and aplastic anemia. Premedication with antihistamines (H1-antagonist +/- H2-antagonist), acetaminophen, and/or corticosteroids may be considered to prevent and/or manage infusion-related reactions. No recommendations for any changes at this time.  Butch Penny, PharmD, CPP Clinical Pharmacist [facility] [phone]

## 2020-01-26 NOTE — Progress Notes (Signed)
   Covid-19 Vaccination Clinic  Name:  Nicholas Hunter    MRN: [id number] DOB: 04/29/1981  01/26/2020  Mr. Matsushima was observed post Covid-19 immunization for 15 minutes without incident. He was provided with Vaccine Information Sheet and instruction to access the V-Safe system.   Mr. Murguia was instructed to call 911 with any severe reactions post vaccine: Marland Kitchen Difficulty breathing  . Swelling of face and throat  . A fast heartbeat  . A bad rash all over body  . Dizziness and weakness   Immunizations Administered    Name Date Dose VIS Date Route   Pfizer COVID-19 Vaccine 01/26/2020  3:31 PM 0.3 mL 12/02/2018 Intramuscular   Manufacturer: [employer], [employer]   Lot: [id number]   NDC: [address]-0

## 2020-01-28 MED FILL — VIT D2 1.25 MG (50,000 UNIT: 1.25 MG | 84 days supply | Qty: 12 | Fill #0

## 2020-02-15 ENCOUNTER — Other Ambulatory Visit (HOSPITAL_COMMUNITY): Payer: Self-pay | Admitting: Physician Assistant

## 2020-02-15 ENCOUNTER — Encounter (HOSPITAL_COMMUNITY): Payer: 59

## 2020-02-17 DIAGNOSIS — I10 Essential (primary) hypertension: Secondary | ICD-10-CM | POA: Diagnosis not present

## 2020-02-17 DIAGNOSIS — E782 Mixed hyperlipidemia: Secondary | ICD-10-CM | POA: Diagnosis not present

## 2020-02-17 DIAGNOSIS — Z0000 Encounter for general adult medical examination without abnormal findings: Secondary | ICD-10-CM | POA: Diagnosis not present

## 2020-02-24 DIAGNOSIS — Z0000 Encounter for general adult medical examination without abnormal findings: Secondary | ICD-10-CM | POA: Diagnosis not present

## 2020-02-24 DIAGNOSIS — I10 Essential (primary) hypertension: Secondary | ICD-10-CM | POA: Diagnosis not present

## 2020-02-24 DIAGNOSIS — E782 Mixed hyperlipidemia: Secondary | ICD-10-CM | POA: Diagnosis not present

## 2020-02-24 DIAGNOSIS — H44132 Sympathetic uveitis, left eye: Secondary | ICD-10-CM | POA: Diagnosis not present

## 2020-02-24 DIAGNOSIS — E6609 Other obesity due to excess calories: Secondary | ICD-10-CM | POA: Diagnosis not present

## 2020-02-25 MED FILL — azaTHIOprine 50 MG TABS: 50 | 30 days supply | Qty: 180 | Fill #1

## 2020-03-30 MED FILL — AMLODIPINE BESYLATE 10 MG T: 10 | 31 days supply | Qty: 31 | Fill #1

## 2020-03-30 MED FILL — AZATHIOPRINE 50 MG TABS: 50 | 30 days supply | Qty: 180 | Fill #2

## 2020-04-21 MED FILL — VIT D2 1.25 MG (50,000 UNIT: 1.25 MG | 84 days supply | Qty: 12 | Fill #0

## 2020-05-05 MED FILL — AMLODIPINE BESYLATE 10 MG T: 10 | 31 days supply | Qty: 31 | Fill #2

## 2020-05-05 MED FILL — AZATHIOPRINE 50 MG TABS: 50 | 30 days supply | Qty: 180 | Fill #3

## 2020-06-07 MED FILL — AMLODIPINE BESYLATE 10 MG T: 10 | 31 days supply | Qty: 31 | Fill #3

## 2020-06-14 ENCOUNTER — Other Ambulatory Visit (HOSPITAL_COMMUNITY): Payer: Self-pay | Admitting: Ophthalmology

## 2020-06-14 DIAGNOSIS — Z79899 Other long term (current) drug therapy: Secondary | ICD-10-CM | POA: Diagnosis not present

## 2020-06-14 DIAGNOSIS — H35372 Puckering of macula, left eye: Secondary | ICD-10-CM | POA: Diagnosis not present

## 2020-06-14 DIAGNOSIS — H30033 Focal chorioretinal inflammation, peripheral, bilateral: Secondary | ICD-10-CM | POA: Diagnosis not present

## 2020-06-14 DIAGNOSIS — Z961 Presence of intraocular lens: Secondary | ICD-10-CM | POA: Diagnosis not present

## 2020-06-14 DIAGNOSIS — H3581 Retinal edema: Secondary | ICD-10-CM | POA: Diagnosis not present

## 2020-06-14 DIAGNOSIS — H44133 Sympathetic uveitis, bilateral: Secondary | ICD-10-CM | POA: Diagnosis not present

## 2020-06-14 MED FILL — PREDNISOLONE AC 1% EYE DROP: 1 | 50 days supply | Qty: 5 | Fill #0

## 2020-06-14 MED FILL — AZATHIOPRINE 50 MG TABS: 50 | 30 days supply | Qty: 180 | Fill #0

## 2020-07-12 DIAGNOSIS — H209 Unspecified iridocyclitis: Secondary | ICD-10-CM | POA: Diagnosis not present

## 2020-07-12 DIAGNOSIS — Z79899 Other long term (current) drug therapy: Secondary | ICD-10-CM | POA: Diagnosis not present

## 2020-07-12 DIAGNOSIS — R3129 Other microscopic hematuria: Secondary | ICD-10-CM | POA: Diagnosis not present

## 2020-07-12 DIAGNOSIS — H44132 Sympathetic uveitis, left eye: Secondary | ICD-10-CM | POA: Diagnosis not present

## 2020-07-12 DIAGNOSIS — E6609 Other obesity due to excess calories: Secondary | ICD-10-CM | POA: Diagnosis not present

## 2020-07-14 MED FILL — AZATHIOPRINE 50 MG TABS: 50 | 30 days supply | Qty: 180 | Fill #1

## 2020-07-14 MED FILL — AMLODIPINE BESYLATE 10 MG T: 10 | 31 days supply | Qty: 31 | Fill #4

## 2020-07-15 ENCOUNTER — Other Ambulatory Visit (HOSPITAL_COMMUNITY): Payer: Self-pay | Admitting: Internal Medicine

## 2020-07-22 MED FILL — VIT D2 1.25 MG (50,000 UNIT: 1.25 MG | 84 days supply | Qty: 12 | Fill #0

## 2020-08-17 DIAGNOSIS — I10 Essential (primary) hypertension: Secondary | ICD-10-CM | POA: Diagnosis not present

## 2020-08-17 DIAGNOSIS — E782 Mixed hyperlipidemia: Secondary | ICD-10-CM | POA: Diagnosis not present

## 2020-08-22 MED FILL — PREDNISOLONE AC 1% EYE DROP: 1 | 50 days supply | Qty: 5 | Fill #1

## 2020-08-22 MED FILL — AZATHIOPRINE 50 MG TABS: 50 | 30 days supply | Qty: 180 | Fill #2

## 2020-08-29 MED FILL — AMLODIPINE BESYLATE 10 MG T: 10 | 31 days supply | Qty: 31 | Fill #5

## 2020-09-13 MED FILL — REMICADE 100 MG SOLR: 100 | 56 days supply | Qty: 12 | Fill #0

## 2020-09-19 DIAGNOSIS — H209 Unspecified iridocyclitis: Secondary | ICD-10-CM | POA: Diagnosis not present

## 2020-09-19 DIAGNOSIS — H44132 Sympathetic uveitis, left eye: Secondary | ICD-10-CM | POA: Diagnosis not present

## 2020-09-28 ENCOUNTER — Other Ambulatory Visit (HOSPITAL_COMMUNITY): Payer: Self-pay | Admitting: Nephrology

## 2020-09-28 MED FILL — AMLODIPINE BESYLATE 10 MG T: 10 | 30 days supply | Qty: 30 | Fill #0

## 2020-09-28 MED FILL — AZATHIOPRINE 50 MG TABS: 50 | 30 days supply | Qty: 180 | Fill #3

## 2020-10-12 DIAGNOSIS — Z79899 Other long term (current) drug therapy: Secondary | ICD-10-CM | POA: Diagnosis not present

## 2020-10-12 DIAGNOSIS — E6609 Other obesity due to excess calories: Secondary | ICD-10-CM | POA: Diagnosis not present

## 2020-10-12 DIAGNOSIS — H209 Unspecified iridocyclitis: Secondary | ICD-10-CM | POA: Diagnosis not present

## 2020-10-12 DIAGNOSIS — R3129 Other microscopic hematuria: Secondary | ICD-10-CM | POA: Diagnosis not present

## 2020-10-12 DIAGNOSIS — H44132 Sympathetic uveitis, left eye: Secondary | ICD-10-CM | POA: Diagnosis not present

## 2020-10-27 ENCOUNTER — Other Ambulatory Visit (HOSPITAL_COMMUNITY): Payer: Self-pay | Admitting: Rheumatology

## 2020-10-27 MED FILL — AMLODIPINE BESYLATE 10 MG T: 10 | 30 days supply | Qty: 30 | Fill #1

## 2020-10-27 MED FILL — AZATHIOPRINE 50 MG TABS: 50 | 90 days supply | Qty: 540 | Fill #0

## 2020-10-27 MED FILL — VIT D2 1.25 MG (50,000 UNIT: 1.25 MG | 84 days supply | Qty: 12 | Fill #0

## 2020-11-01 DIAGNOSIS — I10 Essential (primary) hypertension: Secondary | ICD-10-CM | POA: Diagnosis not present

## 2020-11-01 DIAGNOSIS — H44132 Sympathetic uveitis, left eye: Secondary | ICD-10-CM | POA: Diagnosis not present

## 2020-11-01 DIAGNOSIS — E782 Mixed hyperlipidemia: Secondary | ICD-10-CM | POA: Diagnosis not present

## 2020-11-01 DIAGNOSIS — E6609 Other obesity due to excess calories: Secondary | ICD-10-CM | POA: Diagnosis not present

## 2020-11-09 MED FILL — REMICADE 100 MG SOLR: 100 | 56 days supply | Qty: 12 | Fill #1

## 2020-11-14 DIAGNOSIS — H209 Unspecified iridocyclitis: Secondary | ICD-10-CM | POA: Diagnosis not present

## 2020-11-14 DIAGNOSIS — H44132 Sympathetic uveitis, left eye: Secondary | ICD-10-CM | POA: Diagnosis not present

## 2020-11-17 MED FILL — PREDNISOLONE AC 1% EYE DROP: 1 | 50 days supply | Qty: 5 | Fill #2

## 2020-12-19 DIAGNOSIS — I129 Hypertensive chronic kidney disease with stage 1 through stage 4 chronic kidney disease, or unspecified chronic kidney disease: Secondary | ICD-10-CM | POA: Diagnosis not present

## 2020-12-19 DIAGNOSIS — N2581 Secondary hyperparathyroidism of renal origin: Secondary | ICD-10-CM | POA: Diagnosis not present

## 2020-12-19 DIAGNOSIS — R319 Hematuria, unspecified: Secondary | ICD-10-CM | POA: Diagnosis not present

## 2020-12-19 DIAGNOSIS — N189 Chronic kidney disease, unspecified: Secondary | ICD-10-CM | POA: Diagnosis not present

## 2020-12-19 DIAGNOSIS — H44132 Sympathetic uveitis, left eye: Secondary | ICD-10-CM | POA: Diagnosis not present

## 2020-12-19 DIAGNOSIS — D631 Anemia in chronic kidney disease: Secondary | ICD-10-CM | POA: Diagnosis not present

## 2020-12-19 DIAGNOSIS — E669 Obesity, unspecified: Secondary | ICD-10-CM | POA: Diagnosis not present

## 2020-12-19 DIAGNOSIS — N183 Chronic kidney disease, stage 3 unspecified: Secondary | ICD-10-CM | POA: Diagnosis not present

## 2020-12-30 ENCOUNTER — Other Ambulatory Visit (HOSPITAL_COMMUNITY): Payer: Self-pay

## 2021-01-04 MED FILL — REMICADE 100 MG SOLR: 100 | 56 days supply | Qty: 12 | Fill #2

## 2021-01-09 DIAGNOSIS — H209 Unspecified iridocyclitis: Secondary | ICD-10-CM | POA: Diagnosis not present

## 2021-01-09 DIAGNOSIS — H44132 Sympathetic uveitis, left eye: Secondary | ICD-10-CM | POA: Diagnosis not present

## 2021-01-31 ENCOUNTER — Telehealth: Payer: Self-pay | Admitting: Pharmacist

## 2021-01-31 NOTE — Telephone Encounter (Signed)
Called patient to schedule an appointment for the [employer] [facility]. I was unable to reach the patient so I left a HIPAA-compliant message requesting that the patient return my call.   Luke Van Ausdall, PharmD, BCACP, CPP Clinical Pharmacist [facility] [phone]  

## 2021-02-22 ENCOUNTER — Other Ambulatory Visit (HOSPITAL_COMMUNITY): Payer: Self-pay

## 2021-02-22 MED FILL — Amlodipine Besylate Tab 10 MG (Base Equivalent): ORAL | 30 days supply | Qty: 30 | Fill #0 | Status: AC

## 2021-02-22 MED FILL — Azathioprine Tab 50 MG: ORAL | 90 days supply | Qty: 540 | Fill #0 | Status: AC

## 2021-02-23 ENCOUNTER — Other Ambulatory Visit (HOSPITAL_COMMUNITY): Payer: Self-pay

## 2021-02-24 ENCOUNTER — Other Ambulatory Visit (HOSPITAL_COMMUNITY): Payer: Self-pay

## 2021-02-27 ENCOUNTER — Other Ambulatory Visit (HOSPITAL_COMMUNITY): Payer: Self-pay

## 2021-03-01 ENCOUNTER — Other Ambulatory Visit (HOSPITAL_COMMUNITY): Payer: Self-pay

## 2021-03-01 ENCOUNTER — Other Ambulatory Visit (HOSPITAL_COMMUNITY): Payer: Self-pay | Admitting: Rheumatology

## 2021-03-01 DIAGNOSIS — Z0000 Encounter for general adult medical examination without abnormal findings: Secondary | ICD-10-CM | POA: Diagnosis not present

## 2021-03-02 ENCOUNTER — Other Ambulatory Visit (HOSPITAL_COMMUNITY): Payer: Self-pay

## 2021-03-03 ENCOUNTER — Other Ambulatory Visit (HOSPITAL_COMMUNITY): Payer: Self-pay

## 2021-03-07 ENCOUNTER — Other Ambulatory Visit (HOSPITAL_COMMUNITY): Payer: Self-pay

## 2021-03-08 ENCOUNTER — Other Ambulatory Visit (HOSPITAL_COMMUNITY): Payer: Self-pay

## 2021-03-08 DIAGNOSIS — E6609 Other obesity due to excess calories: Secondary | ICD-10-CM | POA: Diagnosis not present

## 2021-03-08 DIAGNOSIS — L309 Dermatitis, unspecified: Secondary | ICD-10-CM | POA: Diagnosis not present

## 2021-03-08 DIAGNOSIS — I10 Essential (primary) hypertension: Secondary | ICD-10-CM | POA: Diagnosis not present

## 2021-03-08 DIAGNOSIS — H44132 Sympathetic uveitis, left eye: Secondary | ICD-10-CM | POA: Diagnosis not present

## 2021-03-08 DIAGNOSIS — Z0000 Encounter for general adult medical examination without abnormal findings: Secondary | ICD-10-CM | POA: Diagnosis not present

## 2021-03-08 DIAGNOSIS — E782 Mixed hyperlipidemia: Secondary | ICD-10-CM | POA: Diagnosis not present

## 2021-03-08 MED ORDER — CLOTRIMAZOLE-BETAMETHASONE 1-0.05 % EX CREA
TOPICAL_CREAM | CUTANEOUS | 2 refills | Status: DC
  Filled 2021-03-08: qty 30, 30d supply, fill #0
  Filled 2021-05-02: qty 30, 30d supply, fill #1
  Filled 2021-08-17: qty 30, 10d supply, fill #2

## 2021-03-09 ENCOUNTER — Other Ambulatory Visit (HOSPITAL_COMMUNITY): Payer: Self-pay

## 2021-03-09 MED ORDER — INFLIXIMAB 100 MG IV SOLR
INTRAVENOUS | 6 refills | Status: DC
  Filled 2021-03-09: qty 12, 56d supply, fill #0

## 2021-03-10 ENCOUNTER — Other Ambulatory Visit (HOSPITAL_COMMUNITY): Payer: Self-pay

## 2021-03-13 ENCOUNTER — Ambulatory Visit: Payer: 59 | Attending: Internal Medicine | Admitting: Pharmacist

## 2021-03-13 ENCOUNTER — Other Ambulatory Visit (HOSPITAL_COMMUNITY): Payer: Self-pay

## 2021-03-13 ENCOUNTER — Other Ambulatory Visit: Payer: Self-pay

## 2021-03-13 DIAGNOSIS — Z79899 Other long term (current) drug therapy: Secondary | ICD-10-CM

## 2021-03-13 MED ORDER — INFLIXIMAB 100 MG IV SOLR
INTRAVENOUS | 6 refills | Status: DC
  Filled 2021-03-13 – 2021-03-15 (×2): qty 12, 30d supply, fill #0
  Filled 2021-05-02: qty 12, 30d supply, fill #1
  Filled 2021-07-25: qty 12, 30d supply, fill #2
  Filled 2021-09-19: qty 12, 30d supply, fill #3

## 2021-03-13 NOTE — Progress Notes (Signed)
  S: Patient presents for review of their specialty medication therapy.  Patient is currently taking Remicade for uveitis. Patient is managed by Dr. Deanne Coffer for this.   Adherence: confirms  Efficacy: reports taking this ~11 years. He is doing well on this.   Dosing: 1120 mg dose (8 mg/kg)   Screening: TB test: completed; negative per pt Hepatitis: completed, negative per pt  Monitoring: S/sx of infection: none CBC: WNL - blood work followed by Dr. Deanne Coffer S/sx of hypersensitivity: none  S/sx of malignancy: none  S/sx of heart failure: none   Other side effects: none   O:   Lab Results  Component Value Date   WBC 4.8 07/04/2015   HGB 15.0 07/04/2015   HCT 46.0 07/04/2015   MCV 87.5 07/04/2015   PLT 226 07/04/2015      Chemistry      Component Value Date/Time   NA 142 07/04/2015 1418   K 4.0 07/04/2015 1418   CL 106 07/04/2015 1418   CO2 27 07/04/2015 1418   BUN 20 07/04/2015 1418   CREATININE 1.54 (H) 07/04/2015 1418      Component Value Date/Time   CALCIUM 9.4 07/04/2015 1418   ALKPHOS 67 07/04/2015 1418   AST 36 07/04/2015 1418   ALT 26 07/04/2015 1418   BILITOT 0.7 07/04/2015 1418       A/P: 1. 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 Reviewed the medication with the patient, including the following: Remicade is a TNF blocking agent indicated for ankylosing spondylitis, Crohn's disease, psoriatic arthritis, plaque psoriasis, and ulcerative colitis. Use in caution in patients with seizure or CNS demyelinating disorders. Patient educated on purpose, proper use and potential adverse effects of Remicade. Possible adverse effects are increased risk of infections, headache, and injection site reactions. There is the possibility of an increased risk of malignancy but it is not well understood if this increased risk is due to there medication or the disease state. There are rare cases of pancytopenia and aplastic anemia. Premedication with  antihistamines (H1-antagonist +/- H2-antagonist), acetaminophen, and/or corticosteroids may be considered to prevent and/or manage infusion-related reactions. No recommendations for any changes at this time.  Butch Penny, PharmD, P[place]y [place], CPP Clinical Pharmacist [facility] & [facility] [phone]

## 2021-03-14 ENCOUNTER — Other Ambulatory Visit (HOSPITAL_COMMUNITY): Payer: Self-pay

## 2021-03-15 ENCOUNTER — Other Ambulatory Visit (HOSPITAL_BASED_OUTPATIENT_CLINIC_OR_DEPARTMENT_OTHER): Payer: Self-pay

## 2021-03-15 ENCOUNTER — Other Ambulatory Visit (HOSPITAL_COMMUNITY): Payer: Self-pay

## 2021-03-16 ENCOUNTER — Other Ambulatory Visit (HOSPITAL_COMMUNITY): Payer: Self-pay

## 2021-03-28 ENCOUNTER — Other Ambulatory Visit (HOSPITAL_COMMUNITY): Payer: Self-pay

## 2021-03-28 MED FILL — Amlodipine Besylate Tab 10 MG (Base Equivalent): ORAL | 30 days supply | Qty: 30 | Fill #1 | Status: AC

## 2021-04-06 DIAGNOSIS — H44132 Sympathetic uveitis, left eye: Secondary | ICD-10-CM | POA: Diagnosis not present

## 2021-04-06 DIAGNOSIS — H209 Unspecified iridocyclitis: Secondary | ICD-10-CM | POA: Diagnosis not present

## 2021-04-11 DIAGNOSIS — H30033 Focal chorioretinal inflammation, peripheral, bilateral: Secondary | ICD-10-CM | POA: Diagnosis not present

## 2021-04-11 DIAGNOSIS — Z79899 Other long term (current) drug therapy: Secondary | ICD-10-CM | POA: Diagnosis not present

## 2021-04-11 DIAGNOSIS — H44133 Sympathetic uveitis, bilateral: Secondary | ICD-10-CM | POA: Diagnosis not present

## 2021-04-11 DIAGNOSIS — Z961 Presence of intraocular lens: Secondary | ICD-10-CM | POA: Diagnosis not present

## 2021-04-11 DIAGNOSIS — H3581 Retinal edema: Secondary | ICD-10-CM | POA: Diagnosis not present

## 2021-04-11 DIAGNOSIS — H35372 Puckering of macula, left eye: Secondary | ICD-10-CM | POA: Diagnosis not present

## 2021-05-01 ENCOUNTER — Other Ambulatory Visit (HOSPITAL_COMMUNITY): Payer: Self-pay

## 2021-05-02 ENCOUNTER — Other Ambulatory Visit (HOSPITAL_COMMUNITY): Payer: Self-pay

## 2021-05-02 MED FILL — Amlodipine Besylate Tab 10 MG (Base Equivalent): ORAL | 30 days supply | Qty: 30 | Fill #2 | Status: AC

## 2021-05-04 ENCOUNTER — Other Ambulatory Visit (HOSPITAL_COMMUNITY): Payer: Self-pay

## 2021-05-04 DIAGNOSIS — H209 Unspecified iridocyclitis: Secondary | ICD-10-CM | POA: Diagnosis not present

## 2021-05-04 DIAGNOSIS — R21 Rash and other nonspecific skin eruption: Secondary | ICD-10-CM | POA: Diagnosis not present

## 2021-05-04 DIAGNOSIS — Z79899 Other long term (current) drug therapy: Secondary | ICD-10-CM | POA: Diagnosis not present

## 2021-05-04 DIAGNOSIS — R3129 Other microscopic hematuria: Secondary | ICD-10-CM | POA: Diagnosis not present

## 2021-05-04 DIAGNOSIS — H44132 Sympathetic uveitis, left eye: Secondary | ICD-10-CM | POA: Diagnosis not present

## 2021-05-04 MED ORDER — CLOBETASOL PROPIONATE 0.05 % EX CREA
TOPICAL_CREAM | CUTANEOUS | 0 refills | Status: DC
  Filled 2021-05-04: qty 15, 30d supply, fill #0

## 2021-05-05 ENCOUNTER — Other Ambulatory Visit (HOSPITAL_COMMUNITY): Payer: Self-pay

## 2021-05-08 ENCOUNTER — Other Ambulatory Visit (HOSPITAL_COMMUNITY): Payer: Self-pay

## 2021-05-31 ENCOUNTER — Other Ambulatory Visit (HOSPITAL_COMMUNITY): Payer: Self-pay

## 2021-05-31 MED ORDER — SODIUM FLUORIDE 1.1 % DT PSTE
PASTE | DENTAL | 2 refills | Status: DC
  Filled 2021-05-31: qty 100, 30d supply, fill #0

## 2021-06-01 DIAGNOSIS — H44132 Sympathetic uveitis, left eye: Secondary | ICD-10-CM | POA: Diagnosis not present

## 2021-06-02 ENCOUNTER — Other Ambulatory Visit (HOSPITAL_COMMUNITY): Payer: Self-pay

## 2021-06-05 ENCOUNTER — Other Ambulatory Visit (HOSPITAL_COMMUNITY): Payer: Self-pay

## 2021-06-05 MED FILL — Amlodipine Besylate Tab 10 MG (Base Equivalent): ORAL | 30 days supply | Qty: 30 | Fill #3 | Status: AC

## 2021-06-06 ENCOUNTER — Other Ambulatory Visit (HOSPITAL_COMMUNITY): Payer: Self-pay

## 2021-06-28 ENCOUNTER — Other Ambulatory Visit (HOSPITAL_COMMUNITY): Payer: Self-pay

## 2021-07-25 ENCOUNTER — Other Ambulatory Visit (HOSPITAL_COMMUNITY): Payer: Self-pay

## 2021-07-26 ENCOUNTER — Other Ambulatory Visit (HOSPITAL_COMMUNITY): Payer: Self-pay

## 2021-07-27 DIAGNOSIS — H209 Unspecified iridocyclitis: Secondary | ICD-10-CM | POA: Diagnosis not present

## 2021-07-27 DIAGNOSIS — H44132 Sympathetic uveitis, left eye: Secondary | ICD-10-CM | POA: Diagnosis not present

## 2021-08-17 ENCOUNTER — Other Ambulatory Visit (HOSPITAL_COMMUNITY): Payer: Self-pay

## 2021-08-17 MED ORDER — AZATHIOPRINE 50 MG PO TABS
ORAL_TABLET | ORAL | 0 refills | Status: DC
  Filled 2021-08-17: qty 540, 90d supply, fill #0

## 2021-08-17 MED ORDER — AMLODIPINE BESYLATE 10 MG PO TABS
10.0000 mg | ORAL_TABLET | Freq: Every day | ORAL | 5 refills | Status: AC
  Filled 2021-08-17: qty 30, 30d supply, fill #0

## 2021-09-14 ENCOUNTER — Other Ambulatory Visit (HOSPITAL_COMMUNITY): Payer: Self-pay

## 2021-09-18 ENCOUNTER — Other Ambulatory Visit (HOSPITAL_COMMUNITY): Payer: Self-pay

## 2021-09-19 ENCOUNTER — Other Ambulatory Visit (HOSPITAL_COMMUNITY): Payer: Self-pay

## 2023-06-23 ENCOUNTER — Emergency Department (HOSPITAL_BASED_OUTPATIENT_CLINIC_OR_DEPARTMENT_OTHER): Payer: PRIVATE HEALTH INSURANCE

## 2023-06-23 ENCOUNTER — Emergency Department (HOSPITAL_COMMUNITY): Payer: PRIVATE HEALTH INSURANCE

## 2023-06-23 ENCOUNTER — Inpatient Hospital Stay (HOSPITAL_BASED_OUTPATIENT_CLINIC_OR_DEPARTMENT_OTHER)
Admission: EM | Admit: 2023-06-23 | Discharge: 2023-06-26 | DRG: 270 | Disposition: A | Discharge status: Home / Self Care | Payer: PRIVATE HEALTH INSURANCE | Attending: Internal Medicine | Admitting: Internal Medicine

## 2023-06-23 ENCOUNTER — Other Ambulatory Visit: Payer: Self-pay

## 2023-06-23 ENCOUNTER — Encounter (HOSPITAL_COMMUNITY): Payer: Self-pay

## 2023-06-23 ENCOUNTER — Inpatient Hospital Stay (HOSPITAL_COMMUNITY): Payer: PRIVATE HEALTH INSURANCE

## 2023-06-23 DIAGNOSIS — I2609 Other pulmonary embolism with acute cor pulmonale: Secondary | ICD-10-CM | POA: Diagnosis not present

## 2023-06-23 DIAGNOSIS — Z6835 Body mass index (BMI) 35.0-35.9, adult: Secondary | ICD-10-CM | POA: Diagnosis not present

## 2023-06-23 DIAGNOSIS — I82492 Acute embolism and thrombosis of other specified deep vein of left lower extremity: Secondary | ICD-10-CM

## 2023-06-23 DIAGNOSIS — Z79624 Long term (current) use of inhibitors of nucleotide synthesis: Secondary | ICD-10-CM

## 2023-06-23 DIAGNOSIS — D84821 Immunodeficiency due to drugs: Secondary | ICD-10-CM | POA: Diagnosis present

## 2023-06-23 DIAGNOSIS — I82432 Acute embolism and thrombosis of left popliteal vein: Secondary | ICD-10-CM | POA: Diagnosis present

## 2023-06-23 DIAGNOSIS — I82412 Acute embolism and thrombosis of left femoral vein: Secondary | ICD-10-CM | POA: Diagnosis present

## 2023-06-23 DIAGNOSIS — I10 Essential (primary) hypertension: Secondary | ICD-10-CM | POA: Diagnosis present

## 2023-06-23 DIAGNOSIS — Z79620 Long term (current) use of immunosuppressive biologic: Secondary | ICD-10-CM

## 2023-06-23 DIAGNOSIS — E669 Obesity, unspecified: Secondary | ICD-10-CM | POA: Diagnosis present

## 2023-06-23 DIAGNOSIS — I82402 Acute embolism and thrombosis of unspecified deep veins of left lower extremity: Secondary | ICD-10-CM | POA: Diagnosis present

## 2023-06-23 DIAGNOSIS — R609 Edema, unspecified: Secondary | ICD-10-CM | POA: Diagnosis not present

## 2023-06-23 DIAGNOSIS — Z79899 Other long term (current) drug therapy: Secondary | ICD-10-CM

## 2023-06-23 DIAGNOSIS — I2699 Other pulmonary embolism without acute cor pulmonale: Secondary | ICD-10-CM | POA: Diagnosis present

## 2023-06-23 DIAGNOSIS — N182 Chronic kidney disease, stage 2 (mild): Secondary | ICD-10-CM | POA: Diagnosis present

## 2023-06-23 DIAGNOSIS — I82442 Acute embolism and thrombosis of left tibial vein: Secondary | ICD-10-CM | POA: Diagnosis present

## 2023-06-23 DIAGNOSIS — M7989 Other specified soft tissue disorders: Secondary | ICD-10-CM | POA: Diagnosis present

## 2023-06-23 DIAGNOSIS — I129 Hypertensive chronic kidney disease with stage 1 through stage 4 chronic kidney disease, or unspecified chronic kidney disease: Secondary | ICD-10-CM | POA: Diagnosis present

## 2023-06-23 DIAGNOSIS — R252 Cramp and spasm: Secondary | ICD-10-CM | POA: Diagnosis not present

## 2023-06-23 DIAGNOSIS — I82462 Acute embolism and thrombosis of left calf muscular vein: Secondary | ICD-10-CM | POA: Diagnosis present

## 2023-06-23 LAB — HIV ANTIBODY (ROUTINE TESTING W REFLEX): HIV Screen 4th Generation wRfx: NONREACTIVE

## 2023-06-23 LAB — BASIC METABOLIC PANEL
Anion gap: 7 (ref 5–15)
BUN: 22 mg/dL — ABNORMAL HIGH (ref 6–20)
CO2: 26 mmol/L (ref 22–32)
Calcium: 8.7 mg/dL — ABNORMAL LOW (ref 8.9–10.3)
Chloride: 105 mmol/L (ref 98–111)
Creatinine, Ser: 1.46 mg/dL — ABNORMAL HIGH (ref 0.61–1.24)
GFR, Estimated: >60 mL/min (ref >60)
Glucose, Bld: 89 mg/dL (ref 70–99)
Potassium: 4 mmol/L (ref 3.5–5.1)
Sodium: 138 mmol/L (ref 135–145)

## 2023-06-23 LAB — CBC
HCT: 45 % (ref 39.0–52.0)
Hemoglobin: 14.5 g/dL (ref 13.0–17.0)
MCH: 26.3 pg (ref 26.0–34.0)
MCHC: 32.2 g/dL (ref 30.0–36.0)
MCV: 81.5 fL (ref 80.0–100.0)
Platelets: 205 10*3/uL (ref 150–400)
RBC: 5.52 MIL/uL (ref 4.22–5.81)
RDW: 12.2 % (ref 11.5–15.5)
WBC: 7.2 10*3/uL (ref 4.0–10.5)
nRBC: 0 % (ref 0.0–0.2)

## 2023-06-23 LAB — PROTIME-INR
INR: 1 (ref 0.8–1.2)
Prothrombin Time: 13.5 s (ref 11.4–15.2)

## 2023-06-23 LAB — TROPONIN I (HIGH SENSITIVITY)
Troponin I (High Sensitivity): 4 ng/L (ref <18)
Troponin I (High Sensitivity): 4 ng/L (ref <18)

## 2023-06-23 LAB — APTT: aPTT: 27 s (ref 24–36)

## 2023-06-23 LAB — BRAIN NATRIURETIC PEPTIDE: B Natriuretic Peptide: 14.2 pg/mL (ref 0.0–100.0)

## 2023-06-23 LAB — D-DIMER, QUANTITATIVE: D-Dimer, Quant: 5.25 ug{FEU}/mL — ABNORMAL HIGH (ref 0.00–0.50)

## 2023-06-23 LAB — HEPARIN LEVEL (UNFRACTIONATED): Heparin Unfractionated: 0.49 [IU]/mL (ref 0.30–0.70)

## 2023-06-23 MED ORDER — HEPARIN BOLUS VIA INFUSION
3300.0000 [IU] | INTRAVENOUS | Status: AC
  Administered 2023-06-23: 3300 [IU] via INTRAVENOUS
  Filled 2023-06-23: qty 3300

## 2023-06-23 MED ORDER — TRAZODONE HCL 50 MG PO TABS: 25.0000 mg | ORAL_TABLET | Freq: Every evening | ORAL | Status: DC | PRN

## 2023-06-23 MED ORDER — HEPARIN (PORCINE) 25000 UT/250ML-% IV SOLN
2000.0000 [IU]/h | INTRAVENOUS | Status: DC
  Administered 2023-06-23 – 2023-06-26 (×6): 2000 [IU]/h via INTRAVENOUS
  Filled 2023-06-23 (×6): qty 250

## 2023-06-23 MED ORDER — ACETAMINOPHEN 325 MG PO TABS: 650.0000 mg | ORAL_TABLET | Freq: Four times a day (QID) | ORAL | Status: DC | PRN

## 2023-06-23 MED ORDER — TRAMADOL HCL 50 MG PO TABS
100.0000 mg | ORAL_TABLET | Freq: Four times a day (QID) | ORAL | Status: DC | PRN
  Administered 2023-06-25 (×2): 100 mg via ORAL
  Filled 2023-06-23 (×2): qty 2

## 2023-06-23 MED ORDER — IOHEXOL 350 MG/ML SOLN
75.0000 mL | Freq: Once | INTRAVENOUS | Status: AC | PRN
  Administered 2023-06-23: 75 mL via INTRAVENOUS

## 2023-06-23 MED ORDER — ACETAMINOPHEN 650 MG RE SUPP: 650.0000 mg | Freq: Four times a day (QID) | RECTAL | Status: DC | PRN

## 2023-06-23 MED ORDER — ONDANSETRON HCL 4 MG PO TABS: 4.0000 mg | ORAL_TABLET | Freq: Four times a day (QID) | ORAL | Status: DC | PRN

## 2023-06-23 MED ORDER — ONDANSETRON HCL 4 MG/2ML IJ SOLN: 4.0000 mg | Freq: Four times a day (QID) | INTRAMUSCULAR | Status: DC | PRN

## 2023-06-23 NOTE — ED Notes (Signed)
Report to [facility] RN on 4E at [facility]

## 2023-06-23 NOTE — Progress Notes (Signed)
VASCULAR LAB    Left lower extremity venous duplex has been performed.  See CV proc for preliminary results.  Gave verbal report to Melton Alar PA-C and to Dr. Monico Blitz, [facility], RVT 06/23/2023, 1:17 PM

## 2023-06-23 NOTE — ED Notes (Signed)
First attempt at report to [place], RN on 4E [place]

## 2023-06-23 NOTE — Progress Notes (Addendum)
ANTICOAGULATION CONSULT NOTE - Initial Consult  Pharmacy Consult for Heparin Indication: DVT  No Known Allergies  Patient Measurements: Height: 6\' 2"  (188 cm) Weight: 127 kg (280 lb) IBW/kg (Calculated) : 82.2 Heparin Dosing Weight: 110 kg  Vital Signs: Temp: 98.1 F (36.7 C) (09/15 1132) Temp Source: Oral (09/15 1132) BP: 153/99 (09/15 1520) Pulse Rate: 80 (09/15 1520)  Labs: Recent Labs    06/23/23 0916  HGB 14.5  HCT 45.0  PLT 205  CREATININE 1.46*    Estimated Creatinine Clearance: 96.2 mL/min (A) (by C-G formula based on SCr of 1.46 mg/dL (H)).   Medical History: History reviewed. No pertinent past medical history.  Medications:  Scheduled:  Infusions:   Assessment: Patient is a 89 yoM admitted 9/15 with acute DVT, bilateral PE.  Pharmacy is consulted to dose Heparin IV.  No prior to admission anticoagulation noted.    Baseline coags:  INR 1, aPTT 27 D-dimer 5.25 CBC: Hgb and Plt WNL SCr 1.46  Goal of Therapy:  Heparin level 0.3-0.7 units/ml Monitor platelets by anticoagulation protocol: Yes   Plan:  Baseline PTT, PT/INR Give heparin 3300 units bolus IV x 1 ([place] nomogram dosing) Start heparin IV infusion at 2000 units/hr Heparin level 6 hours after starting Daily heparin level and CBC   Lynann Beaver PharmD, BCPS [facility] main pharmacy [phone] 06/23/2023 3:48 PM

## 2023-06-23 NOTE — ED Provider Notes (Signed)
Patient transferred from [facility] for DVT US.  Patient had elevated D-dimer at 5.25.    Patient was evaluated by Dr. Maple Hudson for left lower extremity swelling and pain.  Swelling began on Tuesday and has been worsening since then.  He does not appear to have DVT risk factors.   Physical Exam  BP (!) 153/99   Pulse 80   Temp 98.1 F (36.7 C) (Oral)   Resp 12   Ht 6\' 2"  (1.88 m)   Wt 127 kg   SpO2 100%   BMI 35.95 kg/m   Physical Exam Vitals and nursing note reviewed.  Constitutional:      General: He is not in acute distress.    Appearance: Normal appearance. He is not ill-appearing or diaphoretic.  Cardiovascular:     Rate and Rhythm: Normal rate and regular rhythm.  Pulmonary:     Effort: Pulmonary effort is normal.  Musculoskeletal:     Left lower leg: Swelling and tenderness present. Edema present.     Comments: Erythema and swelling to left lower extremity.  LLE noticeably larger than RLE.  Skin:    General: Skin is warm and dry.     Capillary Refill: Capillary refill takes less than 2 seconds.  Neurological:     Mental Status: He is alert. Mental status is at baseline.  Psychiatric:        Mood and Affect: Mood normal.        Behavior: Behavior normal.     Procedures  Procedures  ED Course / MDM    Medical Decision Making Amount and/or Complexity of Data Reviewed Labs: ordered. Radiology: ordered.  Risk Prescription drug management.   DVT US positive for acute DVT involving left common femoral vein, femoral vein, popliteal vein, posterior tibial veins and peroneal veins.   Informed by [place] tech that patient mentioned he has had some shortness of breath when going up and down stairs.  Will obtain PE study due to large clot burden.   CTA positive for PE with lobar and segmental embolus throughout right lung, and subsegmental embolus in left lung base.  Possible mild right heart strain.  Mild, global cardiomegaly.    I believe patient would benefit  from hospital admission.  Spoke with Dr. Erenest Blank who agrees.  Dr. Erenest Blank recommended consulting vascular surgery to determine if thrombectomy or other intervention required at this time.    Discussed patient with Dr. Chestine Spore, on-call vascular surgery provider, who recommends CT venogram abdomen/pelvis and having patient admitted to [facility].  Vascular will consult on patient when he is admitted to determine if intervention required.    Patient to be admitted to [facility].  Patient started on heparin for DVT/PE treatment.  CT venogram ordered.         Lenard Simmer, PA-C 06/23/23 1605    Rondel Baton, MD 06/23/23 1900

## 2023-06-23 NOTE — ED Triage Notes (Signed)
Pt caox4, ambulatory c/o swelling, redness, and pain in L lower leg since Tuesday. Denies hx DVT or PE. Motor and sensation intact, obvious swelling with redness to L leg lower leg.

## 2023-06-23 NOTE — Consult Note (Signed)
Hospital Consult    Reason for Consult:  Left leg DVT Referring Physician:  ED MRN #:  [id number]  History of Present Illness: This is a 42 y.o. 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  Patient states he started noticing left leg swelling on Tuesday.  He had no associated trauma or injury.  No previous history of blood clots.  States he read about this on the Internet and thought he may have a blood clot in his leg.  Went to [facility] and was diagnosed with extensive left leg DVT into the common femoral vein as well as PE.  He has been started on heparin.  States his left leg hurts about 7 out of 10.  History reviewed. No pertinent past medical history.  No past surgical history on file.  No Known Allergies  Prior to Admission medications   Medication Sig Start Date End Date Taking? Authorizing Provider  amLODipine (NORVASC) 10 MG tablet Take 1 tablet (10 mg total) by mouth daily. 08/17/21  Yes   inFLIXimab (REMICADE) 100 MG injection To be administered at MD's office every 8 weeks at 8 mg/kg (pt wt = 140 kg). 01/26/20  Yes Quentin Angst, MD  mycophenolate (CELLCEPT) 500 MG tablet Take 1,500 mg by mouth 2 (two) times daily. 06/07/23 06/06/24 Yes [provider]  olmesartan-hydrochlorothiazide (BENICAR HCT) 20-12.5 MG tablet Take 1 tablet by mouth daily. 09/25/22  Yes [provider]  prednisoLONE acetate (PRED FORTE) 1 % ophthalmic suspension Place 1 drop into the left eye 4 (four) times daily. 03/20/16  Yes [provider]  sirolimus (RAPAMUNE) 2 MG tablet Take 1 tablet by mouth daily. 05/23/23  Yes [provider]    Social History   Socioeconomic History   Marital status: Single    Spouse name: Not on file   Number of children: Not on file   Years of education: Not on file   Highest education level: Not on file  Occupational History   Not on file   Tobacco Use   Smoking status: Never   Smokeless tobacco: Never  Substance and Sexual Activity   Alcohol use: Yes   Drug use: Not on file   Sexual activity: Not on file  Other Topics Concern   Not on file  Social History Narrative   Not on file   Social Determinants of Health   Financial Resource Strain: Not on file  Food Insecurity: Not on file  Transportation Needs: Not on file  Physical Activity: Not on file  Stress: Not on file  Social Connections: Not on file  Intimate Partner Violence: Not on file    No family history on file.  ROS: [x]  Positive   [ ]  Negative   [ ]  All sytems reviewed and are negative  Cardiovascular: []  chest pain/pressure []  palpitations []  SOB lying flat []  DOE []  pain in legs while walking []  pain in legs at rest []  pain in legs at night []  non-healing ulcers []  hx of DVT [x]  swelling in legs - left  Pulmonary: []  productive cough []  asthma/wheezing []  home O2  Neurologic: []  weakness in []  arms []  legs []  numbness in []  arms []  legs []  hx of CVA []  mini stroke [] difficulty speaking or slurred speech []  temporary loss of vision in one eye []  dizziness  Hematologic: []  hx of cancer []  bleeding problems []  problems with blood clotting easily  Endocrine:   []  diabetes []  thyroid disease  GI []  vomiting blood []  blood in stool  GU: []  CKD/renal failure []  HD--[]  M/W/F or []  T/T/S []  burning with urination []  blood in urine  Psychiatric: []  anxiety []  depression  Musculoskeletal: []  arthritis []  joint pain  Integumentary: []  rashes []  ulcers  Constitutional: []  fever []  chills   Physical Examination  Vitals:   06/23/23 1600 06/23/23 1723  BP: (!) 153/88 138/85  Pulse: 84 94  Resp: 15 20  Temp:  (!) 97.5 F (36.4 C)  SpO2: 99% 100%   Body mass index is 35.95 kg/m.  General:  NAD Gait: Not observed HENT: WNL, normocephalic Pulmonary: normal non-labored breathing Cardiac: regular, without   Murmurs, rubs or gallops; Abdomen:  soft, NT/ND Vascular Exam/Pulses: Palpable DP pulses bilateral lower extremities Left leg twice as big as the right Extremities: without ischemic changes Musculoskeletal: no muscle wasting or atrophy  Neurologic: A&O X 3; Appropriate Affect ; SENSATION: normal; MOTOR FUNCTION:  moving all extremities equally. Speech is fluent/normal   CBC    Component Value Date/Time   WBC 7.2 06/23/2023 0916   RBC 5.52 06/23/2023 0916   HGB 14.5 06/23/2023 0916   HCT 45.0 06/23/2023 0916   PLT 205 06/23/2023 0916   MCV 81.5 06/23/2023 0916   MCH 26.3 06/23/2023 0916   MCHC 32.2 06/23/2023 0916   RDW 12.2 06/23/2023 0916   LYMPHSABS 1.4 07/04/2015 1418   MONOABS 0.6 07/04/2015 1418   EOSABS 0.0 07/04/2015 1418   BASOSABS 0.0 07/04/2015 1418    BMET    Component Value Date/Time   NA 138 06/23/2023 0916   K 4.0 06/23/2023 0916   CL 105 06/23/2023 0916   CO2 26 06/23/2023 0916   GLUCOSE 89 06/23/2023 0916   BUN 22 (H) 06/23/2023 0916   CREATININE 1.46 (H) 06/23/2023 0916   CALCIUM 8.7 (L) 06/23/2023 0916   GFRNONAA >60 06/23/2023 0916   GFRAA >60 07/04/2015 1418    COAGS: Lab Results  Component Value Date   INR 1.0 06/23/2023     Non-Invasive Vascular Imaging:    Left leg duplex shows extensive left leg DVT into the left common femoral vein  ASSESSMENT/PLAN: This is a 42 y.o. male with history of uveitis as well as CKD that vascular surgery has been consulted for extensive left leg DVT.  I discussed standard of care is anticoagulation and heparin has been started appropriately.  I have recommended CT venogram abdomen pelvis to further evaluate the extent of proximal extension.  His left leg is markedly swollen and I think he would likely benefit from percutaneous thrombectomy as I discussed with him.  I discussed this being done in the Cath Lab with him in the prone position and we retrieve the thrombus to try and alleviate his symptoms including  swelling and pain and to alleviate postthrombotic syndrome.  He is nervous about any invasive intervention which is understandable.  Discussed we can further discuss his treatment options after the CT is done tonight and pending how he improves on heparin.  He can eat from my standpoint as the next available time in the Cath Lab is Tuesday as I discussed with him.  Cephus Shelling, MD [facility] Office: [phone]  Cephus Shelling

## 2023-06-23 NOTE — Progress Notes (Addendum)
ANTICOAGULATION CONSULT NOTE - Follow Up  Pharmacy Consult for Heparin Indication: DVT  No Known Allergies  Patient Measurements: Height: 6\' 2"  (188 cm) Weight: 127 kg (280 lb) IBW/kg (Calculated) : 82.2 Heparin Dosing Weight: 110 kg  Vital Signs: Temp: 99.5 F (37.5 C) (09/15 2004) Temp Source: Oral (09/15 2004) BP: 146/89 (09/15 2004) Pulse Rate: 89 (09/15 2004)  Labs: Recent Labs    06/23/23 0916 06/23/23 1514 06/23/23 1739 06/23/23 2301  HGB 14.5  --   --   --   HCT 45.0  --   --   --   PLT 205  --   --   --   APTT  --  27  --   --   LABPROT  --  13.5  --   --   INR  --  1.0  --   --   HEPARINUNFRC  --   --   --  0.49  CREATININE 1.46*  --   --   --   TROPONINIHS  --  4 4  --     Estimated Creatinine Clearance: 96.2 mL/min (A) (by C-G formula based on SCr of 1.46 mg/dL (H)).   Medical History: History reviewed. No pertinent past medical history.  Medications:  Scheduled:  Infusions:   Assessment: Patient is a 45 yoM admitted 9/15 with acute DVT, bilateral PE.  Pharmacy is consulted to dose Heparin IV.  No prior to admission anticoagulation noted.    Baseline coags:  INR 1, aPTT 27 D-dimer 5.25  9/15 PM: heparin level returned at 0.49 on 2000 units/hr (therapeutic). No signs/symptoms of bleeding documented or issues with the heparin infusion running continuously. Last CBC stable  Goal of Therapy:  Heparin level 0.3-0.7 units/ml Monitor platelets by anticoagulation protocol: Yes   Plan:  Continue heparin IV infusion at 2000 units/hr Follow up confirmatory heparin level ~0500 Daily heparin level and CBC  Arabella Merles, PharmD. Clinical Pharmacist 06/23/2023 11:34 PM

## 2023-06-23 NOTE — ED Notes (Signed)
Second attempt at report to [place], RN on 4E at [facility].

## 2023-06-23 NOTE — H&P (Signed)
pericardial effusion. Mediastinum/Nodes: No enlarged mediastinal, hilar, or axillary lymph nodes. Thyroid gland, trachea, and esophagus demonstrate no significant findings. Lungs/Pleura: Lungs are clear. No pleural effusion or pneumothorax. Upper Abdomen: No acute abnormality. Musculoskeletal: No chest wall abnormality. No acute osseous findings. Review of the MIP images confirms the above findings. IMPRESSION: 1. Positive examination for pulmonary embolism with lobar and segmental embolus throughout the right lung, and subsegmental embolus in the left lung base. 2. Borderline elevation of the RV LV ratio, 1.0, which may reflect mild right heart strain. 3. Mild, global cardiomegaly. Call report request placed at the time of interpretation and report issued in the interest of expediency. Final communication of critical findings will be documented. Electronically Signed: By: Jearld Lesch M.D. On: 06/23/2023 15:02   VAS [place] LOWER EXTREMITY VENOUS (DVT) (ONLY [facility] & [facility])  Result Date: 06/23/2023  Lower Venous DVT Study Patient Name:  Nicholas Hunter.  Date of Exam:   06/23/2023 Medical Rec #: [id number]        Accession #:    [id number] Date of Birth: 13-Jul-1981        Patient Gender: M Patient Age:   42 years Exam Location:  [facility] Procedure:      VAS [place] LOWER EXTREMITY VENOUS (DVT) Referring Phys: Melton Alar --------------------------------------------------------------------------------  Indications: Cramping that began in calf 5 days ago, and Edema.  Limitations: Unable to adequately position patient secondary to stretcher chair. Comparison Study: No prior study on file Performing Technologist: Sherren Kerns RVS  Examination Guidelines: A complete evaluation includes B-mode imaging, spectral Doppler, color Doppler, and power Doppler as needed of all accessible portions of each vessel. Bilateral testing is considered an integral part of a complete examination. Limited examinations for reoccurring indications may be performed as noted. The reflux portion of the exam is performed with the patient in reverse Trendelenburg.  +-----+---------------+---------+-----------+----------+--------------+ RIGHTCompressibilityPhasicitySpontaneityPropertiesThrombus Aging +-----+---------------+---------+-----------+----------+--------------+ CFV  Full           Yes      Yes                                 +-----+---------------+---------+-----------+----------+--------------+   +----------+---------------+---------+-----------+----------+-----------------+ LEFT      CompressibilityPhasicitySpontaneityPropertiesThrombus Aging    +----------+---------------+---------+-----------+----------+-----------------+ CFV       None           No       No                                     +----------+---------------+---------+-----------+----------+-----------------+ SFJ                                                     Not well                                                                 visualized        +----------+---------------+---------+-----------+----------+-----------------+ FV Prox   None  History and Physical  Nicholas Hunter UJW:[id number] DOB: 1980/10/12 DOA: 06/23/2023  PCP: Georgianne Fick, MD   Chief Complaint: Left leg pain  HPI: Nicholas Hunter is a 42 y.o. male with medical history significant for uveitis and CKD being admitted to the hospital with extensive left lower extremity DVT as well as acute pulmonary embolus.  Patient states that starting about 5 days ago, he noticed some pain and swelling in his left lower extremity.  Initially he thought that he pulled a muscle in his leg because it was leg today.  Over the last few days, the pain and swelling has somewhat increased and since did not improve he decided to come to the ER for evaluation.  States that in the last couple of days he may have had 1 or 2 episodes of temporary shortness of breath, but denies chest pain, dizziness. No trauma, he is on his feet al day at work.  No personal or family history of blood clots.  Denies any recent weight loss, abdominal pain, or any other symptoms out of the ordinary.  ED Course: He has normal vital signs in the emergency department, lab work remarkable only for stable CKD, and D-dimer 5.25.  He had CT angio of the chest as well as left lower extremity Doppler ultrasound which confirmed extensive left lower extremity DVT, as well as acute PE with borderline heart strain.  He has been started on IV heparin drip.  Review of Systems: Please see HPI for pertinent positives and negatives. A complete 10 system review of systems are otherwise negative.  History reviewed. No pertinent past medical history. No past surgical history on file.  Social History:  reports that he has never smoked. He has never used smokeless tobacco. He reports current alcohol use. No history on file for drug use.   No Known Allergies  No family history on file.   Prior to Admission medications   Medication Sig Start Date End Date Taking? Authorizing Provider  amLODipine (NORVASC) 10 MG tablet Take  1 tablet (10 mg total) by mouth daily. 08/17/21  Yes   mycophenolate (CELLCEPT) 500 MG tablet Take 1,500 mg by mouth 2 (two) times daily. 06/07/23 06/06/24 Yes [provider]  olmesartan-hydrochlorothiazide (BENICAR HCT) 20-12.5 MG tablet Take 1 tablet by mouth daily. 09/25/22  Yes [provider]  prednisoLONE acetate (PRED FORTE) 1 % ophthalmic suspension Place 1 drop into the left eye 4 (four) times daily. 03/20/16  Yes [provider]  sirolimus (RAPAMUNE) 2 MG tablet Take 1 tablet by mouth daily. 05/23/23  Yes [provider]  acyclovir (ZOVIRAX) 800 MG tablet Take by mouth. 06/22/14   [provider]  amLODipine (NORVASC) 10 MG tablet TAKE 1 TABLET BY MOUTH DAILY 09/28/20 09/28/21  Terrial Rhodes, MD  amLODipine (NORVASC) 10 MG tablet TAKE 1 TABLET BY MOUTH DAILY 02/15/20 02/14/21  Collins, Quenten Raven, PA-C  azaTHIOprine (IMURAN) 50 MG tablet TAKE 3 TABLETS BY MOUTH TWICE A DAY 08/17/21     clobetasol cream (TEMOVATE) 0.05 % Apply 1 application Externally Twice a day 05/04/21     clotrimazole-betamethasone (LOTRISONE) cream Apply to the affected area twice daily 03/08/21     inFLIXimab (REMICADE) 100 MG injection To be administered at MD's office every 8 weeks at 8 mg/kg (pt wt = 140 kg). 01/26/20   Quentin Angst, MD  inFLIXimab (REMICADE) 100 MG injection Use as directed Intravenous every 8 weeks 56 days 03/13/21   Quentin Angst, MD  History and Physical  Nicholas Hunter UJW:[id number] DOB: 1980/10/12 DOA: 06/23/2023  PCP: Georgianne Fick, MD   Chief Complaint: Left leg pain  HPI: Nicholas Hunter is a 42 y.o. male with medical history significant for uveitis and CKD being admitted to the hospital with extensive left lower extremity DVT as well as acute pulmonary embolus.  Patient states that starting about 5 days ago, he noticed some pain and swelling in his left lower extremity.  Initially he thought that he pulled a muscle in his leg because it was leg today.  Over the last few days, the pain and swelling has somewhat increased and since did not improve he decided to come to the ER for evaluation.  States that in the last couple of days he may have had 1 or 2 episodes of temporary shortness of breath, but denies chest pain, dizziness. No trauma, he is on his feet al day at work.  No personal or family history of blood clots.  Denies any recent weight loss, abdominal pain, or any other symptoms out of the ordinary.  ED Course: He has normal vital signs in the emergency department, lab work remarkable only for stable CKD, and D-dimer 5.25.  He had CT angio of the chest as well as left lower extremity Doppler ultrasound which confirmed extensive left lower extremity DVT, as well as acute PE with borderline heart strain.  He has been started on IV heparin drip.  Review of Systems: Please see HPI for pertinent positives and negatives. A complete 10 system review of systems are otherwise negative.  History reviewed. No pertinent past medical history. No past surgical history on file.  Social History:  reports that he has never smoked. He has never used smokeless tobacco. He reports current alcohol use. No history on file for drug use.   No Known Allergies  No family history on file.   Prior to Admission medications   Medication Sig Start Date End Date Taking? Authorizing Provider  amLODipine (NORVASC) 10 MG tablet Take  1 tablet (10 mg total) by mouth daily. 08/17/21  Yes   mycophenolate (CELLCEPT) 500 MG tablet Take 1,500 mg by mouth 2 (two) times daily. 06/07/23 06/06/24 Yes [provider]  olmesartan-hydrochlorothiazide (BENICAR HCT) 20-12.5 MG tablet Take 1 tablet by mouth daily. 09/25/22  Yes [provider]  prednisoLONE acetate (PRED FORTE) 1 % ophthalmic suspension Place 1 drop into the left eye 4 (four) times daily. 03/20/16  Yes [provider]  sirolimus (RAPAMUNE) 2 MG tablet Take 1 tablet by mouth daily. 05/23/23  Yes [provider]  acyclovir (ZOVIRAX) 800 MG tablet Take by mouth. 06/22/14   [provider]  amLODipine (NORVASC) 10 MG tablet TAKE 1 TABLET BY MOUTH DAILY 09/28/20 09/28/21  Terrial Rhodes, MD  amLODipine (NORVASC) 10 MG tablet TAKE 1 TABLET BY MOUTH DAILY 02/15/20 02/14/21  Collins, Quenten Raven, PA-C  azaTHIOprine (IMURAN) 50 MG tablet TAKE 3 TABLETS BY MOUTH TWICE A DAY 08/17/21     clobetasol cream (TEMOVATE) 0.05 % Apply 1 application Externally Twice a day 05/04/21     clotrimazole-betamethasone (LOTRISONE) cream Apply to the affected area twice daily 03/08/21     inFLIXimab (REMICADE) 100 MG injection To be administered at MD's office every 8 weeks at 8 mg/kg (pt wt = 140 kg). 01/26/20   Quentin Angst, MD  inFLIXimab (REMICADE) 100 MG injection Use as directed Intravenous every 8 weeks 56 days 03/13/21   Quentin Angst, MD  pericardial effusion. Mediastinum/Nodes: No enlarged mediastinal, hilar, or axillary lymph nodes. Thyroid gland, trachea, and esophagus demonstrate no significant findings. Lungs/Pleura: Lungs are clear. No pleural effusion or pneumothorax. Upper Abdomen: No acute abnormality. Musculoskeletal: No chest wall abnormality. No acute osseous findings. Review of the MIP images confirms the above findings. IMPRESSION: 1. Positive examination for pulmonary embolism with lobar and segmental embolus throughout the right lung, and subsegmental embolus in the left lung base. 2. Borderline elevation of the RV LV ratio, 1.0, which may reflect mild right heart strain. 3. Mild, global cardiomegaly. Call report request placed at the time of interpretation and report issued in the interest of expediency. Final communication of critical findings will be documented. Electronically Signed: By: Jearld Lesch M.D. On: 06/23/2023 15:02   VAS [place] LOWER EXTREMITY VENOUS (DVT) (ONLY [facility])  Result Date: 06/23/2023  Lower Venous DVT Study Patient Name:  Nicholas Hunter.  Date of Exam:   06/23/2023 Medical Rec #: [id number]        Accession #:    [id number] Date of Birth: 13-Jul-1981        Patient Gender: M Patient Age:   42 years Exam Location:  [facility] Procedure:      VAS [place] LOWER EXTREMITY VENOUS (DVT) Referring Phys: Melton Alar --------------------------------------------------------------------------------  Indications: Cramping that began in calf 5 days ago, and Edema.  Limitations: Unable to adequately position patient secondary to stretcher chair. Comparison Study: No prior study on file Performing Technologist: Sherren Kerns RVS  Examination Guidelines: A complete evaluation includes B-mode imaging, spectral Doppler, color Doppler, and power Doppler as needed of all accessible portions of each vessel. Bilateral testing is considered an integral part of a complete examination. Limited examinations for reoccurring indications may be performed as noted. The reflux portion of the exam is performed with the patient in reverse Trendelenburg.  +-----+---------------+---------+-----------+----------+--------------+ RIGHTCompressibilityPhasicitySpontaneityPropertiesThrombus Aging +-----+---------------+---------+-----------+----------+--------------+ CFV  Full           Yes      Yes                                 +-----+---------------+---------+-----------+----------+--------------+   +----------+---------------+---------+-----------+----------+-----------------+ LEFT      CompressibilityPhasicitySpontaneityPropertiesThrombus Aging    +----------+---------------+---------+-----------+----------+-----------------+ CFV       None           No       No                                     +----------+---------------+---------+-----------+----------+-----------------+ SFJ                                                     Not well                                                                 visualized        +----------+---------------+---------+-----------+----------+-----------------+ FV Prox   None  History and Physical  Nicholas Hunter UJW:[id number] DOB: 1980/10/12 DOA: 06/23/2023  PCP: Georgianne Fick, MD   Chief Complaint: Left leg pain  HPI: Nicholas Hunter is a 42 y.o. male with medical history significant for uveitis and CKD being admitted to the hospital with extensive left lower extremity DVT as well as acute pulmonary embolus.  Patient states that starting about 5 days ago, he noticed some pain and swelling in his left lower extremity.  Initially he thought that he pulled a muscle in his leg because it was leg today.  Over the last few days, the pain and swelling has somewhat increased and since did not improve he decided to come to the ER for evaluation.  States that in the last couple of days he may have had 1 or 2 episodes of temporary shortness of breath, but denies chest pain, dizziness. No trauma, he is on his feet al day at work.  No personal or family history of blood clots.  Denies any recent weight loss, abdominal pain, or any other symptoms out of the ordinary.  ED Course: He has normal vital signs in the emergency department, lab work remarkable only for stable CKD, and D-dimer 5.25.  He had CT angio of the chest as well as left lower extremity Doppler ultrasound which confirmed extensive left lower extremity DVT, as well as acute PE with borderline heart strain.  He has been started on IV heparin drip.  Review of Systems: Please see HPI for pertinent positives and negatives. A complete 10 system review of systems are otherwise negative.  History reviewed. No pertinent past medical history. No past surgical history on file.  Social History:  reports that he has never smoked. He has never used smokeless tobacco. He reports current alcohol use. No history on file for drug use.   No Known Allergies  No family history on file.   Prior to Admission medications   Medication Sig Start Date End Date Taking? Authorizing Provider  amLODipine (NORVASC) 10 MG tablet Take  1 tablet (10 mg total) by mouth daily. 08/17/21  Yes   mycophenolate (CELLCEPT) 500 MG tablet Take 1,500 mg by mouth 2 (two) times daily. 06/07/23 06/06/24 Yes [provider]  olmesartan-hydrochlorothiazide (BENICAR HCT) 20-12.5 MG tablet Take 1 tablet by mouth daily. 09/25/22  Yes [provider]  prednisoLONE acetate (PRED FORTE) 1 % ophthalmic suspension Place 1 drop into the left eye 4 (four) times daily. 03/20/16  Yes [provider]  sirolimus (RAPAMUNE) 2 MG tablet Take 1 tablet by mouth daily. 05/23/23  Yes [provider]  acyclovir (ZOVIRAX) 800 MG tablet Take by mouth. 06/22/14   [provider]  amLODipine (NORVASC) 10 MG tablet TAKE 1 TABLET BY MOUTH DAILY 09/28/20 09/28/21  Terrial Rhodes, MD  amLODipine (NORVASC) 10 MG tablet TAKE 1 TABLET BY MOUTH DAILY 02/15/20 02/14/21  Collins, Quenten Raven, PA-C  azaTHIOprine (IMURAN) 50 MG tablet TAKE 3 TABLETS BY MOUTH TWICE A DAY 08/17/21     clobetasol cream (TEMOVATE) 0.05 % Apply 1 application Externally Twice a day 05/04/21     clotrimazole-betamethasone (LOTRISONE) cream Apply to the affected area twice daily 03/08/21     inFLIXimab (REMICADE) 100 MG injection To be administered at MD's office every 8 weeks at 8 mg/kg (pt wt = 140 kg). 01/26/20   Quentin Angst, MD  inFLIXimab (REMICADE) 100 MG injection Use as directed Intravenous every 8 weeks 56 days 03/13/21   Quentin Angst, MD

## 2023-06-23 NOTE — ED Provider Notes (Signed)
[place] EMERGENCY DEPARTMENT AT [facility] Provider Note   CSN: [id number] Arrival date & time: 06/23/23  0844     History  Chief Complaint  Patient presents with   Leg Swelling    Nicholas Hunter. is a 42 y.o. male.  Present emergency department with left lower extremity swelling and pain.  Noted swelling Tuesday and has been worsening since that time.  No trauma to the leg.  No fevers, chills no chest pain no shortness of breath.  Does not appear to have any aggravating/risk factors for DVT.         Home Medications Prior to Admission medications   Medication Sig Start Date End Date Taking? Authorizing Provider  acyclovir (ZOVIRAX) 800 MG tablet Take by mouth. 06/22/14   [provider]  amLODipine (NORVASC) 10 MG tablet Take 1 tablet (10 mg total) by mouth daily. 08/17/21     amLODipine (NORVASC) 10 MG tablet TAKE 1 TABLET BY MOUTH DAILY 09/28/20 09/28/21  Terrial Rhodes, MD  amLODipine (NORVASC) 10 MG tablet TAKE 1 TABLET BY MOUTH DAILY 02/15/20 02/14/21  Oretha Milch, PA-C  azaTHIOprine (IMURAN) 50 MG tablet Take 150 mg by mouth. 07/06/09   [provider]  azaTHIOprine (IMURAN) 50 MG tablet TAKE 3 TABLETS BY MOUTH TWICE A DAY 08/17/21     clobetasol cream (TEMOVATE) 0.05 % Apply 1 application Externally Twice a day 05/04/21     clotrimazole-betamethasone (LOTRISONE) cream Apply to the affected area twice daily 03/08/21     inFLIXimab (REMICADE) 100 MG injection To be administered at MD's office every 8 weeks at 8 mg/kg (pt wt = 140 kg). 01/26/20   Quentin Angst, MD  inFLIXimab (REMICADE) 100 MG injection Use as directed Intravenous every 8 weeks 56 days 03/13/21   Quentin Angst, MD  inFLIXimab (REMICADE) 100 MG injection TO BE ADMINISTERED AT MD'S OFFICE EVERY 8 WEEKS AT 8 MG/KG (PT WT = 140 KG). 01/26/20 01/25/21  Quentin Angst, MD  Multiple Vitamin (MULTIVITAMIN) capsule Take by mouth.    [provider]   mupirocin ointment (BACTROBAN) 2 % Apply 1 application topically 2 (two) times daily. 08/28/18   Vivi Barrack, DPM  prednisoLONE acetate (PRED FORTE) 1 % ophthalmic suspension Place 1 drop into the left eye 2 times daily. 03/20/16   [provider]  Sodium Fluoride 1.1 % PSTE Apply a pea-sized amount of paste to the toothbrush and brush throughly for 2 minutes, preferably at bedtime. Do not eat, drink, or rinse for 30 minutes after use. 05/31/21         Allergies    Patient has no known allergies.    Review of Systems   Review of Systems  Physical Exam Updated Vital Signs BP 129/79 (BP Location: Right Arm)   Pulse 94   Temp 98.4 F (36.9 C) (Oral)   Resp 20   Ht 6\' 2"  (1.88 m)   Wt 127 kg   SpO2 99%   BMI 35.95 kg/m  Physical Exam Vitals and nursing note reviewed.  HENT:     Head: Normocephalic.     Mouth/Throat:     Mouth: Mucous membranes are moist.  Eyes:     Conjunctiva/sclera: Conjunctivae normal.  Cardiovascular:     Rate and Rhythm: Normal rate and regular rhythm.  Pulmonary:     Effort: Pulmonary effort is normal.     Breath sounds: Normal breath sounds.  Abdominal:     General: Abdomen is flat. There  is no distension.  Musculoskeletal:     Comments: Left lower extremity with significant swelling and erythema to the entire lower leg to just above the knee.  Has strong pulses in bilateral lower extremities.  Neurovascularly intact.  Skin:    General: Skin is warm.     Capillary Refill: Capillary refill takes less than 2 seconds.  Neurological:     Mental Status: He is alert and oriented to person, place, and time.  Psychiatric:        Mood and Affect: Mood normal.        Behavior: Behavior normal.     ED Results / Procedures / Treatments   Labs (all labs ordered are listed, but only abnormal results are displayed) Labs Reviewed  BASIC METABOLIC PANEL - Abnormal; Notable for the following components:      Result Value   BUN 22 (*)     Creatinine, Ser 1.46 (*)    Calcium 8.7 (*)    All other components within normal limits  D-DIMER, QUANTITATIVE - Abnormal; Notable for the following components:   D-Dimer, Quant 5.25 (*)    All other components within normal limits  CBC    EKG None  Radiology No results found.  Procedures Procedures    Medications Ordered in ED Medications - No data to display  ED Course/ Medical Decision Making/ A&P                                 Medical Decision Making 42 year old male present emergency department with left lower extremity swelling and edema.  Afebrile vital signs reassuring.  Labs with elevated D-dimer which is concerning for DVT.  He is not having any chest pain or shortness of breath concerning for PE.  Does have CKD.  Creatinine appears to be at baseline.  No leukocytosis to suggest systemic infection.  Given his elevated D-dimer and physical exam findings we will transfer to [facility] for formal ultrasound.  Dr. Jarold Motto has accepted patient.  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  Possible autoimmune component for his DVT? Labs: ordered. Radiology:     Details: Unfortunately we do not have ultrasound here at [facility], will send to [facility] for imaging.          Final Clinical Impression(s) / ED Diagnoses Final diagnoses:  None    Rx / DC Orders ED Discharge Orders     None         Coral Spikes, DO 06/23/23 1004

## 2023-06-23 NOTE — ED Notes (Signed)
[facility] arrived for pt transport to [facility]

## 2023-06-23 NOTE — ED Notes (Signed)
[facility] called for pt transport

## 2023-06-23 NOTE — ED Notes (Signed)
Report for POV transfer from [facility] ED to [facility] ED for [place] given to [facility] CN Gaetano Net.

## 2023-06-24 ENCOUNTER — Encounter (HOSPITAL_COMMUNITY): Payer: Self-pay | Admitting: Internal Medicine

## 2023-06-24 ENCOUNTER — Inpatient Hospital Stay (HOSPITAL_COMMUNITY): Payer: PRIVATE HEALTH INSURANCE

## 2023-06-24 DIAGNOSIS — I2609 Other pulmonary embolism with acute cor pulmonale: Secondary | ICD-10-CM

## 2023-06-24 DIAGNOSIS — I2699 Other pulmonary embolism without acute cor pulmonale: Secondary | ICD-10-CM | POA: Diagnosis not present

## 2023-06-24 LAB — BASIC METABOLIC PANEL
Anion gap: 8 (ref 5–15)
BUN: 16 mg/dL (ref 6–20)
CO2: 25 mmol/L (ref 22–32)
Calcium: 8.4 mg/dL — ABNORMAL LOW (ref 8.9–10.3)
Chloride: 103 mmol/L (ref 98–111)
Creatinine, Ser: 1.25 mg/dL — ABNORMAL HIGH (ref 0.61–1.24)
GFR, Estimated: >60 mL/min (ref >60)
Glucose, Bld: 95 mg/dL (ref 70–99)
Potassium: 3.9 mmol/L (ref 3.5–5.1)
Sodium: 136 mmol/L (ref 135–145)

## 2023-06-24 LAB — ECHOCARDIOGRAM COMPLETE
AR max vel: 3.96 cm2
AV Peak grad: 6.3 mmHg
Ao pk vel: 1.25 m/s
Area-P 1/2: 5.02 cm2
Height: 74 in
MV M vel: 4.39 m/s
MV Peak grad: 77.1 mmHg
S' Lateral: 3.3 cm
Weight: 4480 [oz_av]

## 2023-06-24 LAB — CBC
HCT: 42.1 % (ref 39.0–52.0)
Hemoglobin: 13.4 g/dL (ref 13.0–17.0)
MCH: 26 pg (ref 26.0–34.0)
MCHC: 31.8 g/dL (ref 30.0–36.0)
MCV: 81.6 fL (ref 80.0–100.0)
Platelets: 215 10*3/uL (ref 150–400)
RBC: 5.16 MIL/uL (ref 4.22–5.81)
RDW: 12.1 % (ref 11.5–15.5)
WBC: 6.4 10*3/uL (ref 4.0–10.5)
nRBC: 0 % (ref 0.0–0.2)

## 2023-06-24 LAB — HEPARIN LEVEL (UNFRACTIONATED): Heparin Unfractionated: 0.41 [IU]/mL (ref 0.30–0.70)

## 2023-06-24 MED ORDER — IOHEXOL 350 MG/ML SOLN
75.0000 mL | Freq: Once | INTRAVENOUS | Status: AC | PRN
  Administered 2023-06-24: 75 mL via INTRAVENOUS

## 2023-06-24 NOTE — Progress Notes (Signed)
ANTICOAGULATION CONSULT NOTE - Follow Up  Pharmacy Consult for Heparin Indication: DVT  No Known Allergies  Patient Measurements: Height: 6\' 2"  (188 cm) Weight: 127 kg (280 lb) IBW/kg (Calculated) : 82.2 Heparin Dosing Weight: 110 kg  Vital Signs: Temp: 98.4 F (36.9 C) (09/16 0746) Temp Source: Oral (09/16 0746) BP: 121/81 (09/16 0746) Pulse Rate: 91 (09/16 0746)  Labs: Recent Labs    06/23/23 0916 06/23/23 1514 06/23/23 1739 06/23/23 2301 06/24/23 0842  HGB 14.5  --   --   --  13.4  HCT 45.0  --   --   --  42.1  PLT 205  --   --   --  215  APTT  --  27  --   --   --   LABPROT  --  13.5  --   --   --   INR  --  1.0  --   --   --   HEPARINUNFRC  --   --   --  0.49 0.41  CREATININE 1.46*  --   --   --  1.25*  TROPONINIHS  --  4 4  --   --     Estimated Creatinine Clearance: 112.3 mL/min (A) (by C-G formula based on SCr of 1.25 mg/dL (H)).   Medical History: History reviewed. No pertinent past medical history.   Assessment: Patient is a 65 yoM admitted 9/15 with acute DVT, bilateral PE.  Pharmacy is consulted to dose Heparin IV.  No prior to admission anticoagulation noted.    Heparin level therapeutic at 0.41, CBC stable.  Goal of Therapy:  Heparin level 0.3-0.7 units/ml Monitor platelets by anticoagulation protocol: Yes   Plan:  Continue heparin IV infusion at 2000 units/hr Daily heparin level and CBC  Fredonia Highland, PharmD, BCPS, [facility] Clinical Pharmacist [phone] Please check AMION for all [facility] Pharmacy numbers 06/24/2023

## 2023-06-24 NOTE — Progress Notes (Signed)
Echocardiogram 2D Echocardiogram has been performed.  Nicholas Hunter 06/24/2023, 3:02 PM

## 2023-06-24 NOTE — Progress Notes (Addendum)
[facility] [place]  Subjective  - no new complaints   Objective 105/75 92 98.8 F (37.1 C) (Oral) 15 96%  Intake/Output Summary (Last 24 hours) at 06/24/2023 0714 Last data filed at 06/24/2023 0435 Gross per 24 hour  Intake 523.11 ml  Output --  Net 523.11 ml    Left LE edema with intact motor ans warm to touch, maintained palpable DP pulse Lungs non labored breathing on RA   Assessment/Planning:  Extensive left leg DVT into the left common femoral vein   Pending CT venogram with possible plans for possible percutaneous thrombectomy tomorrow.  Dr. Chestine Spore will review venogram and then plan further discussion of treatment options later today.    Continue Heparin full dose CKD Cr 1.46  Pending work for source of PE/DVT per primary team    Mosetta Pigeon 06/24/2023 7:14 AM --  Laboratory Lab Results: Recent Labs    06/23/23 0916  WBC 7.2  HGB 14.5  HCT 45.0  PLT 205   BMET Recent Labs    06/23/23 0916  NA 138  K 4.0  CL 105  CO2 26  GLUCOSE 89  BUN 22*  CREATININE 1.46*  CALCIUM 8.7*    COAG Lab Results  Component Value Date   INR 1.0 06/23/2023   No results found for: "PTT"   I have seen and evaluated the patient. I agree with the PA note as documented above.  Patient admitted yesterday as a transfer from [facility] long ED with extensive left leg DVT extending into the common femoral vein.  CT abdomen pelvis venogram still pending.  His left leg is still markedly swollen.  Given young and functional, I have recommended a left leg venous thrombectomy.  Tentatively posted for tomorrow in the Cath Lab with Dr. Myra Gianotti.  Please continue heparin.  N.p.o. after midnight.  Consent order placed.  CT venogram still would be helpful and would like to get that completed.  I discussed this is to improve his symptoms and reduce risk of post embolic syndrome.  Cephus Shelling, MD [facility]  [place] Office: [phone]

## 2023-06-24 NOTE — Progress Notes (Signed)
PROGRESS NOTE    Nicholas Hunter  ZOX:[id number] DOB: [birth year] DOA: 06/23/2023 PCP: Georgianne Fick, MD    Brief Narrative:  Nicholas Carmona V. is a 42 y.o. male with medical history significant for uveitis and immunosuppressants and CKD presented to hospital with pain and swelling of his left lower extremity which he initially thought like a pulled muscle but then it increased in size with tenderness.  He also experienced 1-2 episodes of shortness of breath without any chest pain.  In the ED, patient had normal vitals.  Labs showed D-dimer elevated at 5.2.  CT angiogram of the chest showed acute pulm embolism with borderline heart strain.  He also had extensive left lower extremity DVT on duplex ultrasound.  Patient was then considered for admission to hospital for further evaluation and treatment.  Assessment and plan.  Acute DVT and pulmonary embolism. Hemodynamically stable.  CT scan with right heart strain.  On IV drip.  ED provider had spoken with Dr. Chestine Spore of vascular surgery, with evaluation for potential thrombectomy of the left lower extremity. CT venogram was ordered including 2D echocardiogram.  Patient does have a normal BNP and troponin.  Uncertain etiology at this time.  Patient being followed by ophthalmology and rheumatologist as outpatient for autoimmune condition.  They could be underlying hypercoagulable state which might need to be further investigated as outpatient with rheumatologist.  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  Latest creatinine of 1.4.  Will continue to monitor closely.   History of uveitis.  Patient follows up with ophthalmologist and rheumatologist at [facility] and is on immunosuppressants with Remicade as outpatient.  He has been on Remicade for more than 12 years now.  He thinks he has some autoimmune condition doing this.   DVT prophylaxis: Heparin drip.   Code Status:     Code Status: Full Code  Disposition: Home likely in 1 to  2 days  Status is: Inpatient  Remains inpatient appropriate because: Need for CT venogram, possible thrombectomy, IV heparin and anticoagulation,   Family Communication: None at bedside  Consultants:  Vascular surgery  Procedures:  None  Antimicrobials:  None  Anti-infectives (From admission, onward)    None      Subjective: Today, patient was seen and examined at bedside.  Complains of leg pain swelling and tenderness.  No chest pain at this time.  Has some shortness of breath on ambulation.  Objective: Vitals:   06/23/23 2004 06/23/23 2336 06/24/23 0304 06/24/23 0746  BP: (!) 146/89 124/70 105/75 121/81  Pulse: 89 86 92 91  Resp: (!) 22 20 15 20   Temp: 99.5 F (37.5 C) 99.9 F (37.7 C) 98.8 F (37.1 C) 98.4 F (36.9 C)  TempSrc: Oral Oral Oral Oral  SpO2: 98% 99% 96% 98%  Weight:      Height:        Intake/Output Summary (Last 24 hours) at 06/24/2023 0950 Last data filed at 06/24/2023 0435 Gross per 24 hour  Intake 523.11 ml  Output --  Net 523.11 ml   Filed Weights   06/23/23 0857  Weight: 127 kg    Physical Examination: Body mass index is 35.95 kg/m.   General: Obese built, not in obvious distress HENT:   No scleral pallor or icterus noted. Oral mucosa is moist.  Chest: Diminished breath sounds bilaterally.  No crackles or wheezes.  CVS: S1 &S2 heard. No murmur.  Regular rate and rhythm. Abdomen: Soft, nontender, nondistended.  Bowel sounds are heard.  PROGRESS NOTE    Nicholas Hunter  ZOX:[id number] DOB: [birth year] DOA: 06/23/2023 PCP: Georgianne Fick, MD    Brief Narrative:  Nicholas Carmona V. is a 42 y.o. male with medical history significant for uveitis and immunosuppressants and CKD presented to hospital with pain and swelling of his left lower extremity which he initially thought like a pulled muscle but then it increased in size with tenderness.  He also experienced 1-2 episodes of shortness of breath without any chest pain.  In the ED, patient had normal vitals.  Labs showed D-dimer elevated at 5.2.  CT angiogram of the chest showed acute pulm embolism with borderline heart strain.  He also had extensive left lower extremity DVT on duplex ultrasound.  Patient was then considered for admission to hospital for further evaluation and treatment.  Assessment and plan.  Acute DVT and pulmonary embolism. Hemodynamically stable.  CT scan with right heart strain.  On IV drip.  ED provider had spoken with Dr. Chestine Spore of vascular surgery, with evaluation for potential thrombectomy of the left lower extremity. CT venogram was ordered including 2D echocardiogram.  Patient does have a normal BNP and troponin.  Uncertain etiology at this time.  Patient being followed by ophthalmology and rheumatologist as outpatient for autoimmune condition.  They could be underlying hypercoagulable state which might need to be further investigated as outpatient with rheumatologist.  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  Latest creatinine of 1.4.  Will continue to monitor closely.   History of uveitis.  Patient follows up with ophthalmologist and rheumatologist at [facility] and is on immunosuppressants with Remicade as outpatient.  He has been on Remicade for more than 12 years now.  He thinks he has some autoimmune condition doing this.   DVT prophylaxis: Heparin drip.   Code Status:     Code Status: Full Code  Disposition: Home likely in 1 to  2 days  Status is: Inpatient  Remains inpatient appropriate because: Need for CT venogram, possible thrombectomy, IV heparin and anticoagulation,   Family Communication: None at bedside  Consultants:  Vascular surgery  Procedures:  None  Antimicrobials:  None  Anti-infectives (From admission, onward)    None      Subjective: Today, patient was seen and examined at bedside.  Complains of leg pain swelling and tenderness.  No chest pain at this time.  Has some shortness of breath on ambulation.  Objective: Vitals:   06/23/23 2004 06/23/23 2336 06/24/23 0304 06/24/23 0746  BP: (!) 146/89 124/70 105/75 121/81  Pulse: 89 86 92 91  Resp: (!) 22 20 15 20   Temp: 99.5 F (37.5 C) 99.9 F (37.7 C) 98.8 F (37.1 C) 98.4 F (36.9 C)  TempSrc: Oral Oral Oral Oral  SpO2: 98% 99% 96% 98%  Weight:      Height:        Intake/Output Summary (Last 24 hours) at 06/24/2023 0950 Last data filed at 06/24/2023 0435 Gross per 24 hour  Intake 523.11 ml  Output --  Net 523.11 ml   Filed Weights   06/23/23 0857  Weight: 127 kg    Physical Examination: Body mass index is 35.95 kg/m.   General: Obese built, not in obvious distress HENT:   No scleral pallor or icterus noted. Oral mucosa is moist.  Chest: Diminished breath sounds bilaterally.  No crackles or wheezes.  CVS: S1 &S2 heard. No murmur.  Regular rate and rhythm. Abdomen: Soft, nontender, nondistended.  Bowel sounds are heard.  Extremities: No cyanosis, clubbing, left leg with edema tenderness on palpation. Psych: Alert, awake and oriented, normal mood CNS:  No cranial nerve deficits.  Power equal in all extremities.   Skin: Warm and dry.  No rashes noted.  Data Reviewed:   CBC: Recent Labs  Lab 06/23/23 0916 06/24/23 0842  WBC 7.2 6.4  HGB 14.5 13.4  HCT 45.0 42.1  MCV 81.5 81.6  PLT 205 215    Basic Metabolic Panel: Recent Labs  Lab 06/23/23 0916  NA 138  K 4.0  CL 105  CO2 26  GLUCOSE 89  BUN  22*  CREATININE 1.46*  CALCIUM 8.7*    Liver Function Tests: No results for input(s): "AST", "ALT", "ALKPHOS", "BILITOT", "PROT", "ALBUMIN" in the last 168 hours.   Radiology Studies: CT Angio Chest PE W and/or Wo Contrast  Addendum Date: 06/23/2023   ADDENDUM REPORT: 06/23/2023 15:27 ADDENDUM: Findings discussed by telephone with Melton Alar, PA, 3:20 p.m., 06/23/2023 Electronically Signed   By: Jearld Lesch M.D.   On: 06/23/2023 15:27   Result Date: 06/23/2023 CLINICAL DATA:  PE suspected, patient reports blood clot sore legs EXAM: CT ANGIOGRAPHY CHEST WITH CONTRAST TECHNIQUE: Multidetector CT imaging of the chest was performed using the standard protocol during bolus administration of intravenous contrast. Multiplanar CT image reconstructions and MIPs were obtained to evaluate the vascular anatomy. RADIATION DOSE REDUCTION: This exam was performed according to the departmental dose-optimization program which includes automated exposure control, adjustment of the mA and/or kV according to patient size and/or use of iterative reconstruction technique. CONTRAST:  75mL OMNIPAQUE IOHEXOL 350 MG/ML SOLN COMPARISON:  None Available. FINDINGS: Cardiovascular: Satisfactory opacification of the pulmonary arteries to the segmental level. Positive examination for pulmonary embolism with lobar and segmental embolus throughout the right lung, and subsegmental embolus in the left lung base (series 8, image 67, 89). Mild, global cardiomegaly. Borderline elevation of the RV LV ratio, 1.0. No pericardial effusion. Mediastinum/Nodes: No enlarged mediastinal, hilar, or axillary lymph nodes. Thyroid gland, trachea, and esophagus demonstrate no significant findings. Lungs/Pleura: Lungs are clear. No pleural effusion or pneumothorax. Upper Abdomen: No acute abnormality. Musculoskeletal: No chest wall abnormality. No acute osseous findings. Review of the MIP images confirms the above findings. IMPRESSION: 1. Positive  examination for pulmonary embolism with lobar and segmental embolus throughout the right lung, and subsegmental embolus in the left lung base. 2. Borderline elevation of the RV LV ratio, 1.0, which may reflect mild right heart strain. 3. Mild, global cardiomegaly. Call report request placed at the time of interpretation and report issued in the interest of expediency. Final communication of critical findings will be documented. Electronically Signed: By: Jearld Lesch M.D. On: 06/23/2023 15:02   VAS [place] LOWER EXTREMITY VENOUS (DVT) (ONLY [facility] & [facility])  Result Date: 06/23/2023  Lower Venous DVT Study Patient Name:  Lauri Mione.  Date of Exam:   06/23/2023 Medical Rec #: [id number]        Accession #:    [id number] Date of Birth: [birth year]        Patient Gender: M Patient Age:   58 years Exam Location:  [facility] Procedure:      VAS [place] LOWER EXTREMITY VENOUS (DVT) Referring Phys: Melton Alar --------------------------------------------------------------------------------  Indications: Cramping that began in calf 5 days ago, and Edema.  Limitations: Unable to adequately position patient secondary to stretcher chair. Comparison Study: No prior study on file Performing Technologist: Sherren Kerns RVS  Examination Guidelines: A complete evaluation includes B-mode  PROGRESS NOTE    Nicholas Hunter  ZOX:[id number] DOB: [birth year] DOA: 06/23/2023 PCP: Georgianne Fick, MD    Brief Narrative:  Nicholas Carmona V. is a 42 y.o. male with medical history significant for uveitis and immunosuppressants and CKD presented to hospital with pain and swelling of his left lower extremity which he initially thought like a pulled muscle but then it increased in size with tenderness.  He also experienced 1-2 episodes of shortness of breath without any chest pain.  In the ED, patient had normal vitals.  Labs showed D-dimer elevated at 5.2.  CT angiogram of the chest showed acute pulm embolism with borderline heart strain.  He also had extensive left lower extremity DVT on duplex ultrasound.  Patient was then considered for admission to hospital for further evaluation and treatment.  Assessment and plan.  Acute DVT and pulmonary embolism. Hemodynamically stable.  CT scan with right heart strain.  On IV drip.  ED provider had spoken with Dr. Chestine Spore of vascular surgery, with evaluation for potential thrombectomy of the left lower extremity. CT venogram was ordered including 2D echocardiogram.  Patient does have a normal BNP and troponin.  Uncertain etiology at this time.  Patient being followed by ophthalmology and rheumatologist as outpatient for autoimmune condition.  They could be underlying hypercoagulable state which might need to be further investigated as outpatient with rheumatologist.  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  Latest creatinine of 1.4.  Will continue to monitor closely.   History of uveitis.  Patient follows up with ophthalmologist and rheumatologist at [facility] and is on immunosuppressants with Remicade as outpatient.  He has been on Remicade for more than 12 years now.  He thinks he has some autoimmune condition doing this.   DVT prophylaxis: Heparin drip.   Code Status:     Code Status: Full Code  Disposition: Home likely in 1 to  2 days  Status is: Inpatient  Remains inpatient appropriate because: Need for CT venogram, possible thrombectomy, IV heparin and anticoagulation,   Family Communication: None at bedside  Consultants:  Vascular surgery  Procedures:  None  Antimicrobials:  None  Anti-infectives (From admission, onward)    None      Subjective: Today, patient was seen and examined at bedside.  Complains of leg pain swelling and tenderness.  No chest pain at this time.  Has some shortness of breath on ambulation.  Objective: Vitals:   06/23/23 2004 06/23/23 2336 06/24/23 0304 06/24/23 0746  BP: (!) 146/89 124/70 105/75 121/81  Pulse: 89 86 92 91  Resp: (!) 22 20 15 20   Temp: 99.5 F (37.5 C) 99.9 F (37.7 C) 98.8 F (37.1 C) 98.4 F (36.9 C)  TempSrc: Oral Oral Oral Oral  SpO2: 98% 99% 96% 98%  Weight:      Height:        Intake/Output Summary (Last 24 hours) at 06/24/2023 0950 Last data filed at 06/24/2023 0435 Gross per 24 hour  Intake 523.11 ml  Output --  Net 523.11 ml   Filed Weights   06/23/23 0857  Weight: 127 kg    Physical Examination: Body mass index is 35.95 kg/m.   General: Obese built, not in obvious distress HENT:   No scleral pallor or icterus noted. Oral mucosa is moist.  Chest: Diminished breath sounds bilaterally.  No crackles or wheezes.  CVS: S1 &S2 heard. No murmur.  Regular rate and rhythm. Abdomen: Soft, nontender, nondistended.  Bowel sounds are heard.

## 2023-06-24 NOTE — Hospital Course (Signed)
Nicholas Weitzner V. is a 42 y.o. male with medical history significant for uveitis and CKD presented to hospital with pain and swelling of his left lower extremity which he initially thought like a pulled muscle but then it increased in size with tenderness.  He also experienced 1-2 episodes of shortness of breath without any chest pain.  In the ED, patient had normal vitals.  Labs showed D-dimer elevated at 5.2.  CT angiogram of the chest showed acute pulm embolism with borderline heart strain.  He also had extensive left lower extremity DVT on duplex ultrasound.  Patient was then considered for admission to hospital for further evaluation and treatment.  Assessment and plan.  Acute DVT and pulmonary embolism. Hemodynamically stable.  CT scan with right heart strain.  On IV drip.  ED provider had spoken with Dr. Chestine Spore of vascular surgery, with evaluation for potential thrombectomy of the left lower extremity. CT venogram was ordered including 2D echocardiogram.  Patient does have a normal BNP and troponin.  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  Latest creatinine of 1.4.  Will continue to monitor closely.

## 2023-06-25 ENCOUNTER — Inpatient Hospital Stay (HOSPITAL_COMMUNITY)
Admission: EM | Disposition: A | Discharge status: Home / Self Care | Payer: Self-pay | Source: Home / Self Care | Attending: Internal Medicine

## 2023-06-25 DIAGNOSIS — I2699 Other pulmonary embolism without acute cor pulmonale: Secondary | ICD-10-CM | POA: Diagnosis not present

## 2023-06-25 DIAGNOSIS — I82402 Acute embolism and thrombosis of unspecified deep veins of left lower extremity: Secondary | ICD-10-CM

## 2023-06-25 DIAGNOSIS — I82492 Acute embolism and thrombosis of other specified deep vein of left lower extremity: Secondary | ICD-10-CM

## 2023-06-25 HISTORY — PX: PERIPHERAL VASCULAR ULTRASOUND/IVUS: CATH118334

## 2023-06-25 HISTORY — PX: PERIPHERAL VASCULAR THROMBECTOMY: CATH118306

## 2023-06-25 LAB — HEPARIN LEVEL (UNFRACTIONATED): Heparin Unfractionated: 0.52 [IU]/mL (ref 0.30–0.70)

## 2023-06-25 LAB — CBC
HCT: 42.3 % (ref 39.0–52.0)
Hemoglobin: 13.5 g/dL (ref 13.0–17.0)
MCH: 25.8 pg — ABNORMAL LOW (ref 26.0–34.0)
MCHC: 31.9 g/dL (ref 30.0–36.0)
MCV: 80.7 fL (ref 80.0–100.0)
Platelets: 235 10*3/uL (ref 150–400)
RBC: 5.24 MIL/uL (ref 4.22–5.81)
RDW: 11.9 % (ref 11.5–15.5)
WBC: 6 10*3/uL (ref 4.0–10.5)
nRBC: 0 % (ref 0.0–0.2)

## 2023-06-25 LAB — BASIC METABOLIC PANEL
Anion gap: 8 (ref 5–15)
BUN: 14 mg/dL (ref 6–20)
CO2: 22 mmol/L (ref 22–32)
Calcium: 8.3 mg/dL — ABNORMAL LOW (ref 8.9–10.3)
Chloride: 107 mmol/L (ref 98–111)
Creatinine, Ser: 1.32 mg/dL — ABNORMAL HIGH (ref 0.61–1.24)
GFR, Estimated: >60 mL/min (ref >60)
Glucose, Bld: 101 mg/dL — ABNORMAL HIGH (ref 70–99)
Potassium: 3.7 mmol/L (ref 3.5–5.1)
Sodium: 137 mmol/L (ref 135–145)

## 2023-06-25 LAB — MAGNESIUM: Magnesium: 1.9 mg/dL (ref 1.7–2.4)

## 2023-06-25 LAB — POCT ACTIVATED CLOTTING TIME: Activated Clotting Time: 165 s

## 2023-06-25 SURGERY — PERIPHERAL VASCULAR THROMBECTOMY
Anesthesia: LOCAL

## 2023-06-25 MED ORDER — ONDANSETRON HCL 4 MG/2ML IJ SOLN: 4.0000 mg | Freq: Four times a day (QID) | INTRAMUSCULAR | Status: DC | PRN

## 2023-06-25 MED ORDER — ACETAMINOPHEN 325 MG PO TABS: 650.0000 mg | ORAL_TABLET | ORAL | Status: DC | PRN

## 2023-06-25 MED ORDER — SODIUM CHLORIDE 0.9 % WEIGHT BASED INFUSION: 1.0000 mL/kg/h | INTRAVENOUS | Status: DC

## 2023-06-25 MED ORDER — MIDAZOLAM HCL 2 MG/2ML IJ SOLN
INTRAMUSCULAR | Status: AC
  Filled 2023-06-25: qty 2

## 2023-06-25 MED ORDER — HEPARIN SODIUM (PORCINE) 1000 UNIT/ML IJ SOLN
INTRAMUSCULAR | Status: AC
  Filled 2023-06-25: qty 10

## 2023-06-25 MED ORDER — FENTANYL CITRATE (PF) 100 MCG/2ML IJ SOLN
INTRAMUSCULAR | Status: AC
  Filled 2023-06-25: qty 2

## 2023-06-25 MED ORDER — OXYCODONE HCL 5 MG PO TABS: 5.0000 mg | ORAL_TABLET | ORAL | Status: DC | PRN

## 2023-06-25 MED ORDER — MORPHINE SULFATE (PF) 2 MG/ML IV SOLN: 2.0000 mg | INTRAVENOUS | Status: DC | PRN

## 2023-06-25 MED ORDER — IODIXANOL 320 MG/ML IV SOLN
INTRAVENOUS | Status: DC | PRN
  Administered 2023-06-25: 70 mL

## 2023-06-25 MED ORDER — HYDRALAZINE HCL 20 MG/ML IJ SOLN: 5.0000 mg | INTRAMUSCULAR | Status: DC | PRN

## 2023-06-25 MED ORDER — MIDAZOLAM HCL 2 MG/2ML IJ SOLN
INTRAMUSCULAR | Status: DC | PRN
  Administered 2023-06-25 (×2): 1 mg via INTRAVENOUS
  Administered 2023-06-25: 2 mg via INTRAVENOUS
  Administered 2023-06-25: 1 mg via INTRAVENOUS

## 2023-06-25 MED ORDER — SODIUM CHLORIDE 0.9% FLUSH
3.0000 mL | Freq: Two times a day (BID) | INTRAVENOUS | Status: DC
  Administered 2023-06-26: 3 mL via INTRAVENOUS

## 2023-06-25 MED ORDER — FENTANYL CITRATE (PF) 100 MCG/2ML IJ SOLN
INTRAMUSCULAR | Status: DC | PRN
  Administered 2023-06-25 (×2): 25 ug via INTRAVENOUS
  Administered 2023-06-25: 50 ug via INTRAVENOUS
  Administered 2023-06-25: 25 ug via INTRAVENOUS

## 2023-06-25 MED ORDER — SODIUM CHLORIDE 0.9 % IV SOLN: INTRAVENOUS | Status: DC

## 2023-06-25 MED ORDER — SODIUM CHLORIDE 0.9 % IV SOLN: 250.0000 mL | INTRAVENOUS | Status: DC | PRN

## 2023-06-25 MED ORDER — LIDOCAINE HCL (PF) 1 % IJ SOLN
INTRAMUSCULAR | Status: AC
  Filled 2023-06-25: qty 30

## 2023-06-25 MED ORDER — LABETALOL HCL 5 MG/ML IV SOLN
10.0000 mg | INTRAVENOUS | Status: DC | PRN
  Administered 2023-06-25: 10 mg via INTRAVENOUS
  Filled 2023-06-25: qty 4

## 2023-06-25 MED ORDER — SODIUM CHLORIDE 0.9% FLUSH: 3.0000 mL | INTRAVENOUS | Status: DC | PRN

## 2023-06-25 MED ORDER — HEPARIN SODIUM (PORCINE) 1000 UNIT/ML IJ SOLN
INTRAMUSCULAR | Status: DC | PRN
  Administered 2023-06-25: 8000 [IU] via INTRAVENOUS

## 2023-06-25 SURGICAL SUPPLY — 12 items
BALLN MUSTANG 12X80X75 (BALLOONS) ×2
BALLOON MUSTANG 12X80X75 (BALLOONS) IMPLANT
CATH ANGIO 5F BER2 100CM (CATHETERS) IMPLANT
CATH CLOT TRIEVER BOLD (CATHETERS) IMPLANT
CATH VISIONS PV .035 IVUS (CATHETERS) IMPLANT
KIT MICROPUNCTURE NIT STIFF (SHEATH) IMPLANT
SET ATX-X65L (MISCELLANEOUS) IMPLANT
SHEATH CLOT RETRIEVER (SHEATH) IMPLANT
SHEATH PINNACLE 8F 10CM (SHEATH) IMPLANT
TRAY PV CATH (CUSTOM PROCEDURE TRAY) IMPLANT
WIRE AMPLATZ SS-J .035X260CM (WIRE) IMPLANT
WIRE BENTSON .035X145CM (WIRE) IMPLANT

## 2023-06-25 NOTE — Progress Notes (Signed)
S2 heard. No murmur.  Regular rate and rhythm. Abdomen: Soft, nontender, nondistended.  Bowel sounds are heard.   Extremities: No cyanosis, clubbing, left leg with gross edema, mild tenderness on palpation. Psych: Alert, awake and oriented, normal mood CNS:  No cranial nerve deficits.  Power equal in all extremities.   Skin: Warm and dry.  No rashes noted.  Data Reviewed:   CBC: Recent Labs  Lab 06/23/23 0916 06/24/23 0842 06/25/23 0348  WBC 7.2 6.4 6.0  HGB 14.5 13.4 13.5  HCT 45.0 42.1 42.3  MCV 81.5  81.6 80.7  PLT 205 215 235    Basic Metabolic Panel: Recent Labs  Lab 06/23/23 0916 06/24/23 0842 06/25/23 0348  NA 138 136 137  K 4.0 3.9 3.7  CL 105 103 107  CO2 26 25 22   GLUCOSE 89 95 101*  BUN 22* 16 14  CREATININE 1.46* 1.25* 1.32*  CALCIUM 8.7* 8.4* 8.3*  MG  --   --  1.9    Liver Function Tests: No results for input(s): "AST", "ALT", "ALKPHOS", "BILITOT", "PROT", "ALBUMIN" in the last 168 hours.   Radiology Studies: CT VENOGRAM ABDOMEN PELVIS  Result Date: 06/24/2023 CLINICAL DATA:  Known PE, evaluate for DVT EXAM: CT VENOGRAM ABD-PELVIS TECHNIQUE: Multidetector CT imaging of the abdomen and pelvis was performed using the standard protocol during bolus administration of intravenous contrast. Multiplanar reconstructed images and MIPs were obtained and reviewed to evaluate the vascular anatomy. RADIATION DOSE REDUCTION: This exam was performed according to the departmental dose-optimization program which includes automated exposure control, adjustment of the mA and/or kV according to patient size and/or use of iterative reconstruction technique. CONTRAST:  75mL OMNIPAQUE IOHEXOL 350 MG/ML SOLN COMPARISON:  Correlation with CTA chest dated 06/23/2023. CT abdomen/pelvis dated 07/31/2017. FINDINGS: Lower chest: Lung bases are clear. Known right lower lobe pulmonary embolism (series 3/image 1), better evaluated on recent CTA chest. Hepatobiliary: Liver is within normal limits. Layering gallbladder sludge versus noncalcified gallstones (series 3/image 35), without associated inflammatory changes. Pancreas: Within normal limits. Spleen: Within normal limits Adrenals/Urinary Tract: Adrenal glands are within normal limits. Kidneys are within normal limits.  No hydronephrosis. Bladder is within normal limits. Stomach/Bowel: Stomach is within normal limits. No evidence of bowel obstruction. Normal appendix (series 3/image 63). No colonic wall thickening or inflammatory changes.  Vascular/Lymphatic: No evidence of abdominal aortic aneurysm. Atherosclerotic calcifications of the abdominal aorta and branch vessels, although vessels remain patent. Infrahepatic IVC, bilateral iliac veins, and bilateral common femoral veins appear patent. However, there is mild perivascular stranding along the left upper thigh (series 3/image 100), suggesting a nonvisualized left lower extremity DVT. Reproductive: Prostate is unremarkable. Other: No abdominopelvic ascites. Musculoskeletal: Mild degenerative changes at L2-3. IMPRESSION: No visualized pelvic DVT. Mild perivascular stranding along the left upper thigh, suggesting a nonvisualized left lower extremity DVT. Correlate with lower extremity Doppler as clinically warranted. Known right lower lobe pulmonary embolism, better evaluated on recent CTA chest. Electronically Signed   By: Charline Bills M.D.   On: 06/24/2023 18:34   ECHOCARDIOGRAM COMPLETE  Result Date: 06/24/2023    ECHOCARDIOGRAM REPORT   Patient Name:   Nicholas Hunter. Date of Exam: 06/24/2023 Medical Rec #:  [id number]       Height:       74.0 in Accession #:    [id number]      Weight:       280.0 lb Date of Birth:  April 21, 1981       BSA:  S2 heard. No murmur.  Regular rate and rhythm. Abdomen: Soft, nontender, nondistended.  Bowel sounds are heard.   Extremities: No cyanosis, clubbing, left leg with gross edema, mild tenderness on palpation. Psych: Alert, awake and oriented, normal mood CNS:  No cranial nerve deficits.  Power equal in all extremities.   Skin: Warm and dry.  No rashes noted.  Data Reviewed:   CBC: Recent Labs  Lab 06/23/23 0916 06/24/23 0842 06/25/23 0348  WBC 7.2 6.4 6.0  HGB 14.5 13.4 13.5  HCT 45.0 42.1 42.3  MCV 81.5  81.6 80.7  PLT 205 215 235    Basic Metabolic Panel: Recent Labs  Lab 06/23/23 0916 06/24/23 0842 06/25/23 0348  NA 138 136 137  K 4.0 3.9 3.7  CL 105 103 107  CO2 26 25 22   GLUCOSE 89 95 101*  BUN 22* 16 14  CREATININE 1.46* 1.25* 1.32*  CALCIUM 8.7* 8.4* 8.3*  MG  --   --  1.9    Liver Function Tests: No results for input(s): "AST", "ALT", "ALKPHOS", "BILITOT", "PROT", "ALBUMIN" in the last 168 hours.   Radiology Studies: CT VENOGRAM ABDOMEN PELVIS  Result Date: 06/24/2023 CLINICAL DATA:  Known PE, evaluate for DVT EXAM: CT VENOGRAM ABD-PELVIS TECHNIQUE: Multidetector CT imaging of the abdomen and pelvis was performed using the standard protocol during bolus administration of intravenous contrast. Multiplanar reconstructed images and MIPs were obtained and reviewed to evaluate the vascular anatomy. RADIATION DOSE REDUCTION: This exam was performed according to the departmental dose-optimization program which includes automated exposure control, adjustment of the mA and/or kV according to patient size and/or use of iterative reconstruction technique. CONTRAST:  75mL OMNIPAQUE IOHEXOL 350 MG/ML SOLN COMPARISON:  Correlation with CTA chest dated 06/23/2023. CT abdomen/pelvis dated 07/31/2017. FINDINGS: Lower chest: Lung bases are clear. Known right lower lobe pulmonary embolism (series 3/image 1), better evaluated on recent CTA chest. Hepatobiliary: Liver is within normal limits. Layering gallbladder sludge versus noncalcified gallstones (series 3/image 35), without associated inflammatory changes. Pancreas: Within normal limits. Spleen: Within normal limits Adrenals/Urinary Tract: Adrenal glands are within normal limits. Kidneys are within normal limits.  No hydronephrosis. Bladder is within normal limits. Stomach/Bowel: Stomach is within normal limits. No evidence of bowel obstruction. Normal appendix (series 3/image 63). No colonic wall thickening or inflammatory changes.  Vascular/Lymphatic: No evidence of abdominal aortic aneurysm. Atherosclerotic calcifications of the abdominal aorta and branch vessels, although vessels remain patent. Infrahepatic IVC, bilateral iliac veins, and bilateral common femoral veins appear patent. However, there is mild perivascular stranding along the left upper thigh (series 3/image 100), suggesting a nonvisualized left lower extremity DVT. Reproductive: Prostate is unremarkable. Other: No abdominopelvic ascites. Musculoskeletal: Mild degenerative changes at L2-3. IMPRESSION: No visualized pelvic DVT. Mild perivascular stranding along the left upper thigh, suggesting a nonvisualized left lower extremity DVT. Correlate with lower extremity Doppler as clinically warranted. Known right lower lobe pulmonary embolism, better evaluated on recent CTA chest. Electronically Signed   By: Charline Bills M.D.   On: 06/24/2023 18:34   ECHOCARDIOGRAM COMPLETE  Result Date: 06/24/2023    ECHOCARDIOGRAM REPORT   Patient Name:   Nicholas Hunter. Date of Exam: 06/24/2023 Medical Rec #:  [id number]       Height:       74.0 in Accession #:    [id number]      Weight:       280.0 lb Date of Birth:  April 21, 1981       BSA:  PROGRESS NOTE    Nicholas Hunter  NFA:[id number] DOB: 14-Jul-1981 DOA: 06/23/2023 PCP: Georgianne Fick, MD    Brief Narrative:   Nicholas Bechen V. is a 42 y.o. male with medical history significant for uveitis and immunosuppressants and CKD presented to hospital with pain and swelling of his left lower extremity which he initially thought like a pulled muscle but then it increased in size with tenderness.  He also experienced 1-2 episodes of shortness of breath without any chest pain.  In the ED, patient had normal vitals.  Labs showed D-dimer elevated at 5.2.  CT angiogram of the chest showed acute pulm embolism with borderline heart strain.  He also had extensive left lower extremity DVT on duplex ultrasound.  Patient was then considered for admission to hospital for further evaluation and treatment.  Assessment and plan.  Acute DVT and pulmonary embolism. Hemodynamically stable.  CT scan with right heart strain.  On IV drip.  ED provider had spoken with Dr. Chestine Spore of vascular surgery, with evaluation for potential thrombectomy of the left lower extremity. CT venogram with no pelvic DVT.  Ejection fraction of 55 to 60% with LVH. Patient does have a normal BNP and troponin.  Etiology of the DVT uncertain etiology at this time.  Patient being followed by ophthalmology and rheumatologist as outpatient for autoimmune condition.  They could be underlying hypercoagulable state which might need to be further investigated as outpatient with rheumatologist.  Either way treatment  will be anticoagulation at this time and plan for thrombectomy today.   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  Latest creatinine of 1.3 from initial 1.4.  Will continue to monitor closely.   History of uveitis.  Patient follows up with ophthalmologist and rheumatologist at [facility] and is on immunosuppressants with Remicade as outpatient.  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     DVT prophylaxis: Heparin drip.   Code Status:     Code Status: Full Code  Disposition: Home likely in 1 to 2 days, plan for thrombectomy  Status is: Inpatient  Remains inpatient appropriate because: Status post CT venogram, plan for thrombectomy, IV heparin and anticoagulation,   Family Communication: None at bedside  Consultants:  Vascular surgery  Procedures:  None  Antimicrobials:  None  Anti-infectives (From admission, onward)    None      Subjective: Today, patient was seen and examined at bedside complains of mild leg swelling and discomfort.  No chest pain or shortness of breath.  Awaiting for intervention today.    Objective: Vitals:   06/24/23 2100 06/24/23 2325 06/25/23 0337 06/25/23 0754  BP:  (!) 135/91 131/87 132/82  Pulse: 85 88 88 79  Resp: 18 (!) 23 (!) 22 10  Temp:  99 F (37.2 C) 99.1 F (37.3 C) 98.3 F (36.8 C)  TempSrc:  Oral Oral Oral  SpO2: 98% 97% 95% 96%  Weight:   132.1 kg   Height:       No intake or output data in the 24 hours ending 06/25/23 0921  Filed Weights   06/23/23 0857 06/25/23 0337  Weight: 127 kg 132.1 kg    Physical Examination: Body mass index is 37.4 kg/m.   General: Obese built,  not in distress, alert awake and Communicative, HENT:   No scleral pallor or icterus noted. Oral mucosa is moist.  Chest: Diminished breath sounds bilaterally.  No crackles or wheezes.  CVS: S1 &  S2 heard. No murmur.  Regular rate and rhythm. Abdomen: Soft, nontender, nondistended.  Bowel sounds are heard.   Extremities: No cyanosis, clubbing, left leg with gross edema, mild tenderness on palpation. Psych: Alert, awake and oriented, normal mood CNS:  No cranial nerve deficits.  Power equal in all extremities.   Skin: Warm and dry.  No rashes noted.  Data Reviewed:   CBC: Recent Labs  Lab 06/23/23 0916 06/24/23 0842 06/25/23 0348  WBC 7.2 6.4 6.0  HGB 14.5 13.4 13.5  HCT 45.0 42.1 42.3  MCV 81.5  81.6 80.7  PLT 205 215 235    Basic Metabolic Panel: Recent Labs  Lab 06/23/23 0916 06/24/23 0842 06/25/23 0348  NA 138 136 137  K 4.0 3.9 3.7  CL 105 103 107  CO2 26 25 22   GLUCOSE 89 95 101*  BUN 22* 16 14  CREATININE 1.46* 1.25* 1.32*  CALCIUM 8.7* 8.4* 8.3*  MG  --   --  1.9    Liver Function Tests: No results for input(s): "AST", "ALT", "ALKPHOS", "BILITOT", "PROT", "ALBUMIN" in the last 168 hours.   Radiology Studies: CT VENOGRAM ABDOMEN PELVIS  Result Date: 06/24/2023 CLINICAL DATA:  Known PE, evaluate for DVT EXAM: CT VENOGRAM ABD-PELVIS TECHNIQUE: Multidetector CT imaging of the abdomen and pelvis was performed using the standard protocol during bolus administration of intravenous contrast. Multiplanar reconstructed images and MIPs were obtained and reviewed to evaluate the vascular anatomy. RADIATION DOSE REDUCTION: This exam was performed according to the departmental dose-optimization program which includes automated exposure control, adjustment of the mA and/or kV according to patient size and/or use of iterative reconstruction technique. CONTRAST:  75mL OMNIPAQUE IOHEXOL 350 MG/ML SOLN COMPARISON:  Correlation with CTA chest dated 06/23/2023. CT abdomen/pelvis dated 07/31/2017. FINDINGS: Lower chest: Lung bases are clear. Known right lower lobe pulmonary embolism (series 3/image 1), better evaluated on recent CTA chest. Hepatobiliary: Liver is within normal limits. Layering gallbladder sludge versus noncalcified gallstones (series 3/image 35), without associated inflammatory changes. Pancreas: Within normal limits. Spleen: Within normal limits Adrenals/Urinary Tract: Adrenal glands are within normal limits. Kidneys are within normal limits.  No hydronephrosis. Bladder is within normal limits. Stomach/Bowel: Stomach is within normal limits. No evidence of bowel obstruction. Normal appendix (series 3/image 63). No colonic wall thickening or inflammatory changes.  Vascular/Lymphatic: No evidence of abdominal aortic aneurysm. Atherosclerotic calcifications of the abdominal aorta and branch vessels, although vessels remain patent. Infrahepatic IVC, bilateral iliac veins, and bilateral common femoral veins appear patent. However, there is mild perivascular stranding along the left upper thigh (series 3/image 100), suggesting a nonvisualized left lower extremity DVT. Reproductive: Prostate is unremarkable. Other: No abdominopelvic ascites. Musculoskeletal: Mild degenerative changes at L2-3. IMPRESSION: No visualized pelvic DVT. Mild perivascular stranding along the left upper thigh, suggesting a nonvisualized left lower extremity DVT. Correlate with lower extremity Doppler as clinically warranted. Known right lower lobe pulmonary embolism, better evaluated on recent CTA chest. Electronically Signed   By: Charline Bills M.D.   On: 06/24/2023 18:34   ECHOCARDIOGRAM COMPLETE  Result Date: 06/24/2023    ECHOCARDIOGRAM REPORT   Patient Name:   Nicholas Hunter. Date of Exam: 06/24/2023 Medical Rec #:  [id number]       Height:       74.0 in Accession #:    [id number]      Weight:       280.0 lb Date of Birth:  April 21, 1981       BSA:  PROGRESS NOTE    Nicholas Hunter  NFA:[id number] DOB: 14-Jul-1981 DOA: 06/23/2023 PCP: Georgianne Fick, MD    Brief Narrative:   Nicholas Bechen V. is a 42 y.o. male with medical history significant for uveitis and immunosuppressants and CKD presented to hospital with pain and swelling of his left lower extremity which he initially thought like a pulled muscle but then it increased in size with tenderness.  He also experienced 1-2 episodes of shortness of breath without any chest pain.  In the ED, patient had normal vitals.  Labs showed D-dimer elevated at 5.2.  CT angiogram of the chest showed acute pulm embolism with borderline heart strain.  He also had extensive left lower extremity DVT on duplex ultrasound.  Patient was then considered for admission to hospital for further evaluation and treatment.  Assessment and plan.  Acute DVT and pulmonary embolism. Hemodynamically stable.  CT scan with right heart strain.  On IV drip.  ED provider had spoken with Dr. Chestine Spore of vascular surgery, with evaluation for potential thrombectomy of the left lower extremity. CT venogram with no pelvic DVT.  Ejection fraction of 55 to 60% with LVH. Patient does have a normal BNP and troponin.  Etiology of the DVT uncertain etiology at this time.  Patient being followed by ophthalmology and rheumatologist as outpatient for autoimmune condition.  They could be underlying hypercoagulable state which might need to be further investigated as outpatient with rheumatologist.  Either way treatment  will be anticoagulation at this time and plan for thrombectomy today.   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  Latest creatinine of 1.3 from initial 1.4.  Will continue to monitor closely.   History of uveitis.  Patient follows up with ophthalmologist and rheumatologist at [facility] and is on immunosuppressants with Remicade as outpatient.  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     DVT prophylaxis: Heparin drip.   Code Status:     Code Status: Full Code  Disposition: Home likely in 1 to 2 days, plan for thrombectomy  Status is: Inpatient  Remains inpatient appropriate because: Status post CT venogram, plan for thrombectomy, IV heparin and anticoagulation,   Family Communication: None at bedside  Consultants:  Vascular surgery  Procedures:  None  Antimicrobials:  None  Anti-infectives (From admission, onward)    None      Subjective: Today, patient was seen and examined at bedside complains of mild leg swelling and discomfort.  No chest pain or shortness of breath.  Awaiting for intervention today.    Objective: Vitals:   06/24/23 2100 06/24/23 2325 06/25/23 0337 06/25/23 0754  BP:  (!) 135/91 131/87 132/82  Pulse: 85 88 88 79  Resp: 18 (!) 23 (!) 22 10  Temp:  99 F (37.2 C) 99.1 F (37.3 C) 98.3 F (36.8 C)  TempSrc:  Oral Oral Oral  SpO2: 98% 97% 95% 96%  Weight:   132.1 kg   Height:       No intake or output data in the 24 hours ending 06/25/23 0921  Filed Weights   06/23/23 0857 06/25/23 0337  Weight: 127 kg 132.1 kg    Physical Examination: Body mass index is 37.4 kg/m.   General: Obese built,  not in distress, alert awake and Communicative, HENT:   No scleral pallor or icterus noted. Oral mucosa is moist.  Chest: Diminished breath sounds bilaterally.  No crackles or wheezes.  CVS: S1 &  S2 heard. No murmur.  Regular rate and rhythm. Abdomen: Soft, nontender, nondistended.  Bowel sounds are heard.   Extremities: No cyanosis, clubbing, left leg with gross edema, mild tenderness on palpation. Psych: Alert, awake and oriented, normal mood CNS:  No cranial nerve deficits.  Power equal in all extremities.   Skin: Warm and dry.  No rashes noted.  Data Reviewed:   CBC: Recent Labs  Lab 06/23/23 0916 06/24/23 0842 06/25/23 0348  WBC 7.2 6.4 6.0  HGB 14.5 13.4 13.5  HCT 45.0 42.1 42.3  MCV 81.5  81.6 80.7  PLT 205 215 235    Basic Metabolic Panel: Recent Labs  Lab 06/23/23 0916 06/24/23 0842 06/25/23 0348  NA 138 136 137  K 4.0 3.9 3.7  CL 105 103 107  CO2 26 25 22   GLUCOSE 89 95 101*  BUN 22* 16 14  CREATININE 1.46* 1.25* 1.32*  CALCIUM 8.7* 8.4* 8.3*  MG  --   --  1.9    Liver Function Tests: No results for input(s): "AST", "ALT", "ALKPHOS", "BILITOT", "PROT", "ALBUMIN" in the last 168 hours.   Radiology Studies: CT VENOGRAM ABDOMEN PELVIS  Result Date: 06/24/2023 CLINICAL DATA:  Known PE, evaluate for DVT EXAM: CT VENOGRAM ABD-PELVIS TECHNIQUE: Multidetector CT imaging of the abdomen and pelvis was performed using the standard protocol during bolus administration of intravenous contrast. Multiplanar reconstructed images and MIPs were obtained and reviewed to evaluate the vascular anatomy. RADIATION DOSE REDUCTION: This exam was performed according to the departmental dose-optimization program which includes automated exposure control, adjustment of the mA and/or kV according to patient size and/or use of iterative reconstruction technique. CONTRAST:  75mL OMNIPAQUE IOHEXOL 350 MG/ML SOLN COMPARISON:  Correlation with CTA chest dated 06/23/2023. CT abdomen/pelvis dated 07/31/2017. FINDINGS: Lower chest: Lung bases are clear. Known right lower lobe pulmonary embolism (series 3/image 1), better evaluated on recent CTA chest. Hepatobiliary: Liver is within normal limits. Layering gallbladder sludge versus noncalcified gallstones (series 3/image 35), without associated inflammatory changes. Pancreas: Within normal limits. Spleen: Within normal limits Adrenals/Urinary Tract: Adrenal glands are within normal limits. Kidneys are within normal limits.  No hydronephrosis. Bladder is within normal limits. Stomach/Bowel: Stomach is within normal limits. No evidence of bowel obstruction. Normal appendix (series 3/image 63). No colonic wall thickening or inflammatory changes.  Vascular/Lymphatic: No evidence of abdominal aortic aneurysm. Atherosclerotic calcifications of the abdominal aorta and branch vessels, although vessels remain patent. Infrahepatic IVC, bilateral iliac veins, and bilateral common femoral veins appear patent. However, there is mild perivascular stranding along the left upper thigh (series 3/image 100), suggesting a nonvisualized left lower extremity DVT. Reproductive: Prostate is unremarkable. Other: No abdominopelvic ascites. Musculoskeletal: Mild degenerative changes at L2-3. IMPRESSION: No visualized pelvic DVT. Mild perivascular stranding along the left upper thigh, suggesting a nonvisualized left lower extremity DVT. Correlate with lower extremity Doppler as clinically warranted. Known right lower lobe pulmonary embolism, better evaluated on recent CTA chest. Electronically Signed   By: Charline Bills M.D.   On: 06/24/2023 18:34   ECHOCARDIOGRAM COMPLETE  Result Date: 06/24/2023    ECHOCARDIOGRAM REPORT   Patient Name:   Nicholas Hunter. Date of Exam: 06/24/2023 Medical Rec #:  [id number]       Height:       74.0 in Accession #:    [id number]      Weight:       280.0 lb Date of Birth:  April 21, 1981       BSA:  cardiomegaly. Call report request placed at the time of interpretation and report issued in the interest of expediency. Final communication of critical findings will be documented. Electronically Signed: By: Jearld Lesch M.D. On: 06/23/2023 15:02   VAS [place] LOWER EXTREMITY VENOUS (DVT) (ONLY [facility] & [facility])  Result Date: 06/23/2023  Lower Venous DVT Study Patient Name:  Nicholas Hunter.  Date of Exam:   06/23/2023 Medical Rec #: [id number]         Accession #:    [id number] Date of Birth: 1981-08-28        Patient Gender: M Patient Age:   42 years Exam Location:  [facility] Procedure:      VAS [place] LOWER EXTREMITY VENOUS (DVT) Referring Phys: Melton Alar --------------------------------------------------------------------------------  Indications: Cramping that began in calf 5 days ago, and Edema.  Limitations: Unable to adequately position patient secondary to stretcher chair. Comparison Study: No prior study on file Performing Technologist: Sherren Kerns RVS  Examination Guidelines: A complete evaluation includes B-mode imaging, spectral Doppler, color Doppler, and power Doppler as needed of all accessible portions of each vessel. Bilateral testing is considered an integral part of a complete examination. Limited examinations for reoccurring indications may be performed as noted. The reflux portion of the exam is performed with the patient in reverse Trendelenburg.  +-----+---------------+---------+-----------+----------+--------------+ RIGHTCompressibilityPhasicitySpontaneityPropertiesThrombus Aging +-----+---------------+---------+-----------+----------+--------------+ CFV  Full           Yes      Yes                                 +-----+---------------+---------+-----------+----------+--------------+   +----------+---------------+---------+-----------+----------+-----------------+ LEFT      CompressibilityPhasicitySpontaneityPropertiesThrombus Aging    +----------+---------------+---------+-----------+----------+-----------------+ CFV       None           No       No                                     +----------+---------------+---------+-----------+----------+-----------------+ SFJ                                                    Not well                                                                 visualized        +----------+---------------+---------+-----------+----------+-----------------+  FV Prox   None           No       No                                     +----------+---------------+---------+-----------+----------+-----------------+ FV Mid    None           No       No                                     +----------+---------------+---------+-----------+----------+-----------------+

## 2023-06-25 NOTE — Progress Notes (Signed)
     Left LE significant edema with intact motor and gross sensation, palpable DP pulse    Extensive left leg DVT into the left common femoral vein     CT VENOGRAM ABD-PELVIS   TECHNIQUE: Multidetector CT imaging of the abdomen and pelvis was performed using the standard protocol during bolus administration of intravenous contrast. Multiplanar reconstructed images and MIPs were obtained and reviewed to evaluate the vascular anatomy.   RADIATION DOSE REDUCTION: This exam was performed according to the departmental dose-optimization program which includes automated exposure control, adjustment of the mA and/or kV according to patient size and/or use of iterative reconstruction technique.   CONTRAST:  75mL OMNIPAQUE IOHEXOL 350 MG/ML SOLN   COMPARISON:  Correlation with CTA chest dated 06/23/2023. CT abdomen/pelvis dated 07/31/2017.   FINDINGS: Lower chest: Lung bases are clear. Known right lower lobe pulmonary embolism (series 3/image 1), better evaluated on recent CTA chest.   Hepatobiliary: Liver is within normal limits.   Layering gallbladder sludge versus noncalcified gallstones (series 3/image 35), without associated inflammatory changes.   Pancreas: Within normal limits.   Spleen: Within normal limits   Adrenals/Urinary Tract: Adrenal glands are within normal limits.   Kidneys are within normal limits.  No hydronephrosis.   Bladder is within normal limits.   Stomach/Bowel: Stomach is within normal limits.   No evidence of bowel obstruction.   Normal appendix (series 3/image 63).   No colonic wall thickening or inflammatory changes.   Vascular/Lymphatic: No evidence of abdominal aortic aneurysm.   Atherosclerotic calcifications of the abdominal aorta and branch vessels, although vessels remain patent.   Infrahepatic IVC, bilateral iliac veins, and bilateral common femoral veins appear patent. However, there is mild perivascular stranding along the left  upper thigh (series 3/image 100), suggesting a nonvisualized left lower extremity DVT.   Reproductive: Prostate is unremarkable.   Other: No abdominopelvic ascites.   Musculoskeletal: Mild degenerative changes at L2-3.   IMPRESSION: No visualized pelvic DVT.   Mild perivascular stranding along the left upper thigh, suggesting a nonvisualized left lower extremity DVT. Correlate with lower extremity Doppler as clinically warranted.   Known right lower lobe pulmonary embolism, better evaluated on recent CTA chest.     NPO with plans for  thrombectomy today with [facility].  He agrees to proceed Continue Heparin  Mosetta Pigeon PA-C

## 2023-06-25 NOTE — Progress Notes (Signed)
[facility] [place]  Subjective  -left leg still significantly swollen   Objective 132/82 79 98.3 F (36.8 C) (Oral) 10 96% No intake or output data in the 24 hours ending 06/25/23 0802  Left leg twice the size of the right leg  Laboratory Lab Results: Recent Labs    06/24/23 0842 06/25/23 0348  WBC 6.4 6.0  HGB 13.4 13.5  HCT 42.1 42.3  PLT 215 235   BMET Recent Labs    06/24/23 0842 06/25/23 0348  NA 136 137  K 3.9 3.7  CL 103 107  CO2 25 22  GLUCOSE 95 101*  BUN 16 14  CREATININE 1.25* 1.32*  CALCIUM 8.4* 8.3*    COAG Lab Results  Component Value Date   INR 1.0 06/23/2023   No results found for: "PTT"  Assessment/Planning:  42 year old male admitted with extensive left leg DVT with duplex showing clot into the common femoral vein.  He did get a CT abdomen pelvis venogram last night and the contrast timing is not great but there is not any significant involvement of the iliac vein.  I still think he would benefit from percutaneous thrombectomy given the severity of his symptoms and he has known thrombus into the common femoral.  Keep NPO.  Continue heparin.  Cath Lab today with Dr. Myra Gianotti.  Cephus Shelling 06/25/2023 8:02 AM --

## 2023-06-25 NOTE — Interval H&P Note (Signed)
History and Physical Interval Note:  06/25/2023 1:07 PM  [employer] V.  has presented today for surgery, with the diagnosis of DVT - LEFT LEG.  The various methods of treatment have been discussed with the patient and family. After consideration of risks, benefits and other options for treatment, the patient has consented to  Procedure(s): PERIPHERAL VASCULAR THROMBECTOMY - VENOUS (N/A) as a surgical intervention.  The patient's history has been reviewed, patient examined, no change in status, stable for surgery.  I have reviewed the patient's chart and labs.  Questions were answered to the patient's satisfaction.     Durene Cal

## 2023-06-25 NOTE — Op Note (Signed)
Patient name: Nicholas Hunter MRN: [id number] DOB: [birth year] Sex: male  06/25/2023 Pre-operative Diagnosis: Left leg DVT Post-operative diagnosis:  Same Surgeon:  Durene Cal Procedure Performed:  1.  Ultrasound-guided access, left popliteal vein  2.  Intravascular ultrasound (IVUS) of the inferior vena cava, left common iliac, external iliac, common femoral, femoral, and popliteal veins  3.  Mechanical thrombectomy of the left common iliac, external iliac, common femoral, femoral, and popliteal veins  4.  Balloon venoplasty of the left common iliac, external iliac, common femoral, femoral, and popliteal veins  5.  Left leg and inferior vena cava venogram  6.  Conscious sedation, 59 minutes   Indications: This is a 42 year old gentleman with significant left leg DVT comes in today for further evaluation and possible dimension.  Procedure:  The patient was identified in the holding area and taken to room 8.  The patient was then placed supine on the table and prepped and draped in the usual sterile fashion.  A time out was called.  Conscious sedation was administered with the use of IV fentanyl and Versed under continuous physician and nurse monitoring.  Heart rate, blood pressure, and oxygen saturation were continuously monitored.  Total sedation time was 59 minutes.  Ultrasound was used to evaluate the left popliteal vein which was filled with thrombus and not compressible.  1% lidocaine was used for local anesthesia.  The left popliteal vein was then cannulated under ultrasound guidance with a micropuncture needle.  A 018 wire was inserted followed by placement of micropuncture sheath.  Next using a Bentson wire and a Berenstein catheter, access was obtained into the subclavian vein.  A Amplatz wire was then placed.  A 5 French sheath was inserted.  Intravascular ultrasound was then performed evaluating the left popliteal, femoral, common femoral, external iliac and common iliac veins as  well as inferior vena cava.  The inferior vena cava and iliac veins with the exception of the distal external iliac appeared to be free of thrombus.  There was significant thrombus burden within the femoral-popliteal vessels.  Next, I dilated the skin and placed a Inari sheath.  I gave the patient an additional 8000 units of heparin.  I used the Inari 12 [place] bold device and perform mechanical thrombectomy of the left common iliac, external iliac, common femoral, femoral and popliteal veins.  4 passes in all 4 quadrants were made.  I then used a 12 mm Mustang balloon to perform balloon venoplasty of the left common iliac, external iliac, common femoral, femoral, and popliteal veins.  I then reimage the veins with IVUS.  No residual thrombus was visualized.  I performed venography of the left leg and inferior vena cava.  This showed that the femoral-popliteal veins were widely patent as was the common femoral and external iliac vein.  There did appear to be a bifurcation in the common iliac vein and the inferior vena cava was widely patent.  I then reinserted the 12 mm balloon and repeated balloon venoplasty in the common iliac vein.  The 12 mm balloon was easily moved proximally and distally at full inflation.  I felt this confirmed that this was not an issue.  I repeated IVUS and I did not see any residual problems.  The sheath was removed and the access site was closed with a Monocryl.  The leg was then wrapped in Kerlix, Ace and Coban.  There were no immediate complications.   Impression:  #1  Successful mechanical venous thrombectomy of  the left iliofemoral and popliteal veins with subsequent balloon venoplasty using a 12 mm balloon  #2  The patient can be converted to Eliquis tomorrow and fitted for thigh-high compression socks  #3  He will need follow-up in 1 month with ilio caval and left leg venous ultrasound studies     V. Durene Cal, M.D., [facility]  [place] Office: [phone] Pager:  [phone]

## 2023-06-25 NOTE — Progress Notes (Signed)
ANTICOAGULATION CONSULT NOTE - Follow Up  Pharmacy Consult for Heparin Indication: DVT  No Known Allergies  Patient Measurements: Height: 6\' 2"  (188 cm) Weight: 132.1 kg (291 lb 4.8 oz) IBW/kg (Calculated) : 82.2 Heparin Dosing Weight: 110 kg  Vital Signs: Temp: 98.3 F (36.8 C) (09/17 0754) Temp Source: Oral (09/17 0754) BP: 132/82 (09/17 0754) Pulse Rate: 79 (09/17 0754)  Labs: Recent Labs    06/23/23 0916 06/23/23 1514 06/23/23 1739 06/23/23 2301 06/24/23 0842 06/25/23 0348  HGB 14.5  --   --   --  13.4 13.5  HCT 45.0  --   --   --  42.1 42.3  PLT 205  --   --   --  215 235  APTT  --  27  --   --   --   --   LABPROT  --  13.5  --   --   --   --   INR  --  1.0  --   --   --   --   HEPARINUNFRC  --   --   --  0.49 0.41 0.52  CREATININE 1.46*  --   --   --  1.25* 1.32*  TROPONINIHS  --  4 4  --   --   --     Estimated Creatinine Clearance: 108.6 mL/min (A) (by C-G formula based on SCr of 1.32 mg/dL (H)).   Medical History: History reviewed. No pertinent past medical history.   Assessment: Patient is a Nicholas Hunter admitted 9/15 with acute DVT, bilateral PE.  Pharmacy is consulted to dose Heparin IV.  No prior to admission anticoagulation noted.    Heparin level therapeutic at 0.52, CBC stable. Planning for thrombectomy later today.  Goal of Therapy:  Heparin level 0.3-0.7 units/ml Monitor platelets by anticoagulation protocol: Yes   Plan:  Continue heparin IV infusion at 2000 units/hr Daily heparin level and CBC  Fredonia Highland, PharmD, BCPS, [facility] Clinical Pharmacist [phone] Please check AMION for all [facility] Pharm[facility] numbers 06/25/2023

## 2023-06-25 NOTE — H&P (View-Only) (Signed)
[facility] [place]  Subjective  -left leg still significantly swollen   Objective 132/82 79 98.3 F (36.8 C) (Oral) 10 96% No intake or output data in the 24 hours ending 06/25/23 0802  Left leg twice the size of the right leg  Laboratory Lab Results: Recent Labs    06/24/23 0842 06/25/23 0348  WBC 6.4 6.0  HGB 13.4 13.5  HCT 42.1 42.3  PLT 215 235   BMET Recent Labs    06/24/23 0842 06/25/23 0348  NA 136 137  K 3.9 3.7  CL 103 107  CO2 25 22  GLUCOSE 95 101*  BUN 16 14  CREATININE 1.25* 1.32*  CALCIUM 8.4* 8.3*    COAG Lab Results  Component Value Date   INR 1.0 06/23/2023   No results found for: "PTT"  Assessment/Planning:  42 year old male admitted with extensive left leg DVT with duplex showing clot into the common femoral vein.  He did get a CT abdomen pelvis venogram last night and the contrast timing is not great but there is not any significant involvement of the iliac vein.  I still think he would benefit from percutaneous thrombectomy given the severity of his symptoms and he has known thrombus into the common femoral.  Keep NPO.  Continue heparin.  Cath Lab today with Dr. Myra Gianotti.  Cephus Shelling 06/25/2023 8:02 AM --

## 2023-06-26 ENCOUNTER — Other Ambulatory Visit (HOSPITAL_COMMUNITY): Payer: Self-pay

## 2023-06-26 ENCOUNTER — Encounter (HOSPITAL_COMMUNITY): Payer: Self-pay | Admitting: Surgery

## 2023-06-26 DIAGNOSIS — I10 Essential (primary) hypertension: Secondary | ICD-10-CM | POA: Diagnosis not present

## 2023-06-26 DIAGNOSIS — I2699 Other pulmonary embolism without acute cor pulmonale: Secondary | ICD-10-CM | POA: Diagnosis not present

## 2023-06-26 LAB — LIPID PANEL
Cholesterol: 156 mg/dL (ref 0–200)
HDL: 32 mg/dL — ABNORMAL LOW (ref >40)
LDL Cholesterol: 112 mg/dL — ABNORMAL HIGH (ref 0–99)
Total CHOL/HDL Ratio: 4.9 ratio
Triglycerides: 58 mg/dL (ref <150)
VLDL: 12 mg/dL (ref 0–40)

## 2023-06-26 MED ORDER — APIXABAN 5 MG PO TABS
10.0000 mg | ORAL_TABLET | Freq: Two times a day (BID) | ORAL | Status: DC
  Administered 2023-06-26: 10 mg via ORAL
  Filled 2023-06-26: qty 2

## 2023-06-26 MED ORDER — APIXABAN (ELIQUIS) VTE STARTER PACK (10MG AND 5MG): ORAL_TABLET | ORAL | 0 refills | Status: DC

## 2023-06-26 MED ORDER — ROSUVASTATIN CALCIUM 5 MG PO TABS
5.0000 mg | ORAL_TABLET | Freq: Every day | ORAL | 3 refills | Status: AC
Start: 2023-06-26 — End: 2024-06-25
  Filled 2023-06-26 – 2023-07-21 (×2): qty 30, 30d supply, fill #0

## 2023-06-26 MED ORDER — APIXABAN 5 MG PO TABS: 5.0000 mg | ORAL_TABLET | Freq: Two times a day (BID) | ORAL | Status: DC

## 2023-06-26 MED ORDER — APIXABAN (ELIQUIS) VTE STARTER PACK (10MG AND 5MG)
ORAL_TABLET | ORAL | 0 refills | Status: AC
Start: 2023-06-26 — End: ?
  Filled 2023-06-26: qty 74, 30d supply, fill #0

## 2023-06-26 MED FILL — Lidocaine HCl Local Preservative Free (PF) Inj 1%: INTRAMUSCULAR | Qty: 30 | Status: AC

## 2023-06-26 NOTE — Plan of Care (Signed)
  Problem: Education: Goal: Knowledge of General Education information will improve Description: Including pain rating scale, medication(s)/side effects and non-pharmacologic comfort measures Outcome: Adequate for Discharge   Problem: Health Behavior/Discharge Planning: Goal: Ability to manage health-related needs will improve Outcome: Adequate for Discharge   Problem: Clinical Measurements: Goal: Ability to maintain clinical measurements within normal limits will improve Outcome: Adequate for Discharge Goal: Will remain free from infection Outcome: Adequate for Discharge Goal: Diagnostic test results will improve Outcome: Adequate for Discharge Goal: Respiratory complications will improve Outcome: Adequate for Discharge Goal: Cardiovascular complication will be avoided Outcome: Adequate for Discharge   Problem: Coping: Goal: Level of anxiety will decrease Outcome: Adequate for Discharge   Problem: Elimination: Goal: Will not experience complications related to bowel motility Outcome: Adequate for Discharge Goal: Will not experience complications related to urinary retention Outcome: Adequate for Discharge   Problem: Skin Integrity: Goal: Risk for impaired skin integrity will decrease Outcome: Adequate for Discharge   Problem: Education: Goal: Understanding of CV disease, CV risk reduction, and recovery process will improve Outcome: Adequate for Discharge Goal: Individualized Educational Video(s) Outcome: Adequate for Discharge   Problem: Cardiovascular: Goal: Ability to achieve and maintain adequate cardiovascular perfusion will improve Outcome: Adequate for Discharge Goal: Vascular access site(s) Level 0-1 will be maintained Outcome: Adequate for Discharge

## 2023-06-26 NOTE — TOC Benefit Eligibility Note (Signed)
Patient Product/process development scientist completed.    The patient is insured through [facility]. Patient has [employer], may use a copay card, and/or apply for patient assistance if available.    Ran test claim for Eliquis 5 mg and the current 30 day co-pay is $45.00.   This test claim was processed through [facility]- copay amounts may vary at other pharmacies due to pharmacy/plan contracts, or as the patient moves through the different stages of their insurance plan.     Roland Earl, CPHT Pharmacy Technician III Certified Patient Advocate [facility] Patient Advocate Team Direct Number: [phone]  Fax: [phone]

## 2023-06-26 NOTE — Progress Notes (Signed)
PHARMACIST LIPID MONITORING   Nicholas Guckert. is a 42 y.o. male admitted on 06/23/2023 with DVT and bilateral PE.  Pharmacy has been consulted to optimize lipid-lowering therapy with the indication of primary prevention for clinical ASCVD.  Recent Labs:  Lipid Panel (last 6 months):   Lab Results  Component Value Date   CHOL 156 06/26/2023   TRIG 58 06/26/2023   HDL 32 (L) 06/26/2023   CHOLHDL 4.9 06/26/2023   VLDL 12 06/26/2023   LDLCALC 112 (H) 06/26/2023    Hepatic function panel (last 6 months):   No results found for: "AST", "ALT", "ALKPHOS", "BILITOT", "BILIDIR", "IBILI"  SCr (since admission):   Serum creatinine: 1.1 mg/dL 16/10/96 0454 Estimated creatinine clearance: 130.3 mL/min  Current therapy and lipid therapy tolerance Current lipid-lowering therapy: none Previous lipid-lowering therapies (if applicable): none Documented or reported allergies or intolerances to lipid-lowering therapies (if applicable): none  Assessment:   Patient agrees with changes to lipid-lowering therapy  Plan:    1.Statin intensity (high intensity recommended for all patients regardless of the LDL):  Add or increase statin to high intensity.  2.Add ezetimibe (if any one of the following):   Not indicated at this time.  3.Refer to lipid clinic:   No  4.Follow-up with:  Primary care provider - Georgianne Fick, MD  5.Follow-up labs after discharge:  Changes in lipid therapy were made. Check a lipid panel in 8-12 weeks then annually.      Trixie Rude, PharmD Clinical Pharmacist 06/26/2023  10:48 AM

## 2023-06-26 NOTE — Progress Notes (Addendum)
[facility] [place]  Subjective  - feeling better, minimal pain   Objective (!) 134/94 65 97.8 F (36.6 C) (Oral) 19 95%  Intake/Output Summary (Last 24 hours) at 06/26/2023 0803 Last data filed at 06/26/2023 0700 Gross per 24 hour  Intake --  Output 825 ml  Net -825 ml    Left LE with softer compartments after dressing removed Palpable DP, skin warm to touch Lungs non labored breathing  Assessment/Planning: This is a 42 year old gentleman with significant left leg DVT   Impression:             #1  Successful mechanical venous thrombectomy of the left iliofemoral and popliteal veins with subsequent balloon venoplasty using a 12 mm balloon             #2  The patient can be converted to Eliquis tomorrow and fitted for thigh-high compression socks Compression ordered.  RN will remove left posterior popliteal stitch before placing compression              #3  He will need follow-up in 1 month with ilio caval and left leg venous ultrasound studies.  F/U sent to office  Mosetta Pigeon 06/26/2023 8:03 AM --  Laboratory Lab Results: Recent Labs    06/25/23 0348 06/26/23 0540  WBC 6.0 5.6  HGB 13.5 11.3*  HCT 42.3 35.6*  PLT 235 204   BMET Recent Labs    06/25/23 0348 06/26/23 0540  NA 137 139  K 3.7 3.6  CL 107 109  CO2 22 20*  GLUCOSE 101* 93  BUN 14 11  CREATININE 1.32* 1.10  CALCIUM 8.3* 7.4*    COAG Lab Results  Component Value Date   INR 1.0 06/23/2023   No results found for: "PTT"  I have seen and evaluated the patient. I agree with the PA note as documented above.  Post procedure day 1 status post left lower extremity percutaneous venous thrombectomy for extensive DVT.  Feels his left leg is improved some today.  Okay to transition to DOAC.  Will order compression stocking and remove bolster from the leg.  Will arrange follow-up in 1 month.  Can discharge from our standpoint Likely will need outpatient follow-up with  heme-onc for hypercoagulable work-up given unprovoked DVT.  Cephus Shelling, MD [facility] [place] Office: [phone]

## 2023-06-26 NOTE — Progress Notes (Signed)
ANTICOAGULATION CONSULT NOTE - Follow Up  Pharmacy Consult for Heparin > Eliquis Indication: DVT  No Known Allergies  Patient Measurements: Height: 6\' 2"  (188 cm) Weight: 132.1 kg (291 lb 4.8 oz) IBW/kg (Calculated) : 82.2 Heparin Dosing Weight: 110 kg  Vital Signs: Temp: 97.8 F (36.6 C) (09/18 0745) Temp Source: Oral (09/18 0745) BP: 134/94 (09/18 0745) Pulse Rate: 65 (09/18 0745)  Labs: Recent Labs    06/23/23 1514 06/23/23 1739 06/23/23 2301 06/24/23 0842 06/25/23 0348 06/26/23 0540  HGB  --   --   --  13.4 13.5 11.3*  HCT  --   --   --  42.1 42.3 35.6*  PLT  --   --   --  215 235 204  APTT 27  --   --   --   --   --   LABPROT 13.5  --   --   --   --   --   INR 1.0  --   --   --   --   --   HEPARINUNFRC  --   --    < > 0.41 0.52 0.50  CREATININE  --   --   --  1.25* 1.32* 1.10  TROPONINIHS 4 4  --   --   --   --    < > = values in this interval not displayed.    Estimated Creatinine Clearance: 130.3 mL/min (by C-G formula based on SCr of 1.1 mg/dL).   Medical History: History reviewed. No pertinent past medical history.   Assessment: Patient is a 6 yoM admitted 9/15 with acute DVT, bilateral PE with mild RHS now s/p successful left leg thrombectomy 9/17 with no residual thrombus. Pharmacy is consulted to transition from heparin to Eliquis.  Goal of Therapy:  Monitor platelets by anticoagulation protocol: Yes   Plan:  STOP heparin infusion START Eliquis 10 mg BID x7 days, then 5 mg BID thereafter  Trixie Rude, PharmD Clinical Pharmacist 06/26/2023  8:36 AM

## 2023-06-26 NOTE — Discharge Summary (Signed)
Physician Discharge Summary  [name] ZOX:[id number] DOB: 1981-04-18 DOA: 06/23/2023  PCP: Georgianne Fick, MD  Admit date: 06/23/2023 Discharge date: 06/26/2023  Admitted From: Home  Discharge disposition: Home  Recommendations for Outpatient Follow-Up:   Follow up with your primary care provider in one week.  Check CBC, BMP, magnesium in the next visit Follow-up with Dr. Chestine Spore vascular surgery in 1 week.  Office to schedule an appointment. Will need hypercoagulable workup secondary to extensive DVT and PE.  Would recommend further workup as outpatient. Patient will likely need indefinite anticoagulation due to extensive nature of DVT and PE.Marland Kitchen   Discharge Diagnosis:   Principal Problem:   Acute pulmonary embolism (HCC) Active Problems:   Essential hypertension   Discharge Condition: Improved.  Diet recommendation:  Regular.  Wound care: None.  Code status: Full.   History of Present Illness:   Nicholas Hunter. is a 42 y.o. male with medical history significant for uveitis and immunosuppressants and CKD presented to hospital with pain and swelling of his left lower extremity which he initially thought like a pulled muscle but then it increased in size with tenderness.  He also experienced 1-2 episodes of shortness of breath without any chest pain.  In the ED, patient had normal vitals.  Labs showed D-dimer elevated at 5.2.  CT angiogram of the chest showed acute pulm embolism with borderline heart strain.  He also had extensive left lower extremity DVT on duplex ultrasound.  Patient was then considered for admission to the hospital for further evaluation and treatment.   Hospital Course:   Following conditions were addressed during hospitalization as listed below,  Acute DVT and pulmonary embolism. Patient remained hemodynamically stable.  CT scan with right heart strain.  Patient was started on heparin drip in vascular surgery was consulted who recommended  thrombectomy of the left lower extremity. CT venogram with no pelvic DVT.  Patient then underwent mechanical thrombectomy on 06/25/2023 by vascular surgery.  Of note 2D echocardiogram showed ejection fraction of 55 to 60% with LVH. Patient had normal BNP and troponin on presentation..  Etiology of the DVT uncertain etiology at this time.  Patient being followed by ophthalmology and rheumatologist as outpatient for autoimmune condition.  They could be underlying hypercoagulable state which might need to be further investigated as outpatient with rheumatologist.  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  Recommend outpatient follow-up.  History of uveitis.  Patient follows up with ophthalmologist and rheumatologist at [facility] and is on immunosuppressants with Remicade as outpatient.  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  Disposition.  At this time, patient is stable for disposition home with outpatient PCP and vascular surgery follow-up.  Would recommend follow-up with rheumatology as well.  Medical Consultants:  Vascular surgery.  Procedures:    Mechanical thrombectomy 06/25/2023. Subjective:   Today, patient was seen and examined at bedside.  Feels better.  Less leg swelling.  No breath or chest pain.  Discharge Exam:   Vitals:   06/26/23 0745 06/26/23 1147  BP: (!) 134/94 (!) 151/96  Pulse: 65   Resp: 19   Temp: 97.8 F (36.6 C) 98.4 F (36.9 C)  SpO2: 95%    Vitals:   06/25/23 2342 06/26/23 0419 06/26/23 0745 06/26/23 1147  BP: (!) 129/97 120/85 (!) 134/94 (!) 151/96  Pulse:  Physician Discharge Summary  [name] ZOX:[id number] DOB: 1981-04-18 DOA: 06/23/2023  PCP: Georgianne Fick, MD  Admit date: 06/23/2023 Discharge date: 06/26/2023  Admitted From: Home  Discharge disposition: Home  Recommendations for Outpatient Follow-Up:   Follow up with your primary care provider in one week.  Check CBC, BMP, magnesium in the next visit Follow-up with Dr. Chestine Spore vascular surgery in 1 week.  Office to schedule an appointment. Will need hypercoagulable workup secondary to extensive DVT and PE.  Would recommend further workup as outpatient. Patient will likely need indefinite anticoagulation due to extensive nature of DVT and PE.Marland Kitchen   Discharge Diagnosis:   Principal Problem:   Acute pulmonary embolism (HCC) Active Problems:   Essential hypertension   Discharge Condition: Improved.  Diet recommendation:  Regular.  Wound care: None.  Code status: Full.   History of Present Illness:   Nicholas Hunter. is a 42 y.o. male with medical history significant for uveitis and immunosuppressants and CKD presented to hospital with pain and swelling of his left lower extremity which he initially thought like a pulled muscle but then it increased in size with tenderness.  He also experienced 1-2 episodes of shortness of breath without any chest pain.  In the ED, patient had normal vitals.  Labs showed D-dimer elevated at 5.2.  CT angiogram of the chest showed acute pulm embolism with borderline heart strain.  He also had extensive left lower extremity DVT on duplex ultrasound.  Patient was then considered for admission to the hospital for further evaluation and treatment.   Hospital Course:   Following conditions were addressed during hospitalization as listed below,  Acute DVT and pulmonary embolism. Patient remained hemodynamically stable.  CT scan with right heart strain.  Patient was started on heparin drip in vascular surgery was consulted who recommended  thrombectomy of the left lower extremity. CT venogram with no pelvic DVT.  Patient then underwent mechanical thrombectomy on 06/25/2023 by vascular surgery.  Of note 2D echocardiogram showed ejection fraction of 55 to 60% with LVH. Patient had normal BNP and troponin on presentation..  Etiology of the DVT uncertain etiology at this time.  Patient being followed by ophthalmology and rheumatologist as outpatient for autoimmune condition.  They could be underlying hypercoagulable state which might need to be further investigated as outpatient with rheumatologist.  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  Recommend outpatient follow-up.  History of uveitis.  Patient follows up with ophthalmologist and rheumatologist at [facility] and is on immunosuppressants with Remicade as outpatient.  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  Disposition.  At this time, patient is stable for disposition home with outpatient PCP and vascular surgery follow-up.  Would recommend follow-up with rheumatology as well.  Medical Consultants:  Vascular surgery.  Procedures:    Mechanical thrombectomy 06/25/2023. Subjective:   Today, patient was seen and examined at bedside.  Feels better.  Less leg swelling.  No breath or chest pain.  Discharge Exam:   Vitals:   06/26/23 0745 06/26/23 1147  BP: (!) 134/94 (!) 151/96  Pulse: 65   Resp: 19   Temp: 97.8 F (36.6 C) 98.4 F (36.9 C)  SpO2: 95%    Vitals:   06/25/23 2342 06/26/23 0419 06/26/23 0745 06/26/23 1147  BP: (!) 129/97 120/85 (!) 134/94 (!) 151/96  Pulse:  Physician Discharge Summary  [name] ZOX:[id number] DOB: 1981-04-18 DOA: 06/23/2023  PCP: Georgianne Fick, MD  Admit date: 06/23/2023 Discharge date: 06/26/2023  Admitted From: Home  Discharge disposition: Home  Recommendations for Outpatient Follow-Up:   Follow up with your primary care provider in one week.  Check CBC, BMP, magnesium in the next visit Follow-up with Dr. Chestine Spore vascular surgery in 1 week.  Office to schedule an appointment. Will need hypercoagulable workup secondary to extensive DVT and PE.  Would recommend further workup as outpatient. Patient will likely need indefinite anticoagulation due to extensive nature of DVT and PE.Marland Kitchen   Discharge Diagnosis:   Principal Problem:   Acute pulmonary embolism (HCC) Active Problems:   Essential hypertension   Discharge Condition: Improved.  Diet recommendation:  Regular.  Wound care: None.  Code status: Full.   History of Present Illness:   Nicholas Hunter. is a 42 y.o. male with medical history significant for uveitis and immunosuppressants and CKD presented to hospital with pain and swelling of his left lower extremity which he initially thought like a pulled muscle but then it increased in size with tenderness.  He also experienced 1-2 episodes of shortness of breath without any chest pain.  In the ED, patient had normal vitals.  Labs showed D-dimer elevated at 5.2.  CT angiogram of the chest showed acute pulm embolism with borderline heart strain.  He also had extensive left lower extremity DVT on duplex ultrasound.  Patient was then considered for admission to the hospital for further evaluation and treatment.   Hospital Course:   Following conditions were addressed during hospitalization as listed below,  Acute DVT and pulmonary embolism. Patient remained hemodynamically stable.  CT scan with right heart strain.  Patient was started on heparin drip in vascular surgery was consulted who recommended  thrombectomy of the left lower extremity. CT venogram with no pelvic DVT.  Patient then underwent mechanical thrombectomy on 06/25/2023 by vascular surgery.  Of note 2D echocardiogram showed ejection fraction of 55 to 60% with LVH. Patient had normal BNP and troponin on presentation..  Etiology of the DVT uncertain etiology at this time.  Patient being followed by ophthalmology and rheumatologist as outpatient for autoimmune condition.  They could be underlying hypercoagulable state which might need to be further investigated as outpatient with rheumatologist.  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  Recommend outpatient follow-up.  History of uveitis.  Patient follows up with ophthalmologist and rheumatologist at [facility] and is on immunosuppressants with Remicade as outpatient.  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  Disposition.  At this time, patient is stable for disposition home with outpatient PCP and vascular surgery follow-up.  Would recommend follow-up with rheumatology as well.  Medical Consultants:  Vascular surgery.  Procedures:    Mechanical thrombectomy 06/25/2023. Subjective:   Today, patient was seen and examined at bedside.  Feels better.  Less leg swelling.  No breath or chest pain.  Discharge Exam:   Vitals:   06/26/23 0745 06/26/23 1147  BP: (!) 134/94 (!) 151/96  Pulse: 65   Resp: 19   Temp: 97.8 F (36.6 C) 98.4 F (36.9 C)  SpO2: 95%    Vitals:   06/25/23 2342 06/26/23 0419 06/26/23 0745 06/26/23 1147  BP: (!) 129/97 120/85 (!) 134/94 (!) 151/96  Pulse:  cardiomegaly. Call report request placed at the time of interpretation and report issued in the interest of expediency. Final communication of critical  findings will be documented. Electronically Signed: By: Jearld Lesch M.D. On: 06/23/2023 15:02   VAS [place] LOWER EXTREMITY VENOUS (DVT) (ONLY [facility] & [facility])  Result Date: 06/23/2023  Lower Venous DVT Study Patient Name:  Owen Caughlin.  Date of Exam:   06/23/2023 Medical Rec #: [id number]        Accession #:    [id number] Date of Birth: 1980-11-18        Patient Gender: M Patient Age:   42 years Exam Location:  [facility] Procedure:      VAS [place] LOWER EXTREMITY VENOUS (DVT) Referring Phys: Melton Alar --------------------------------------------------------------------------------  Indications: Cramping that began in calf 5 days ago, and Edema.  Limitations: Unable to adequately position patient secondary to stretcher chair. Comparison Study: No prior study on file Performing Technologist: Sherren Kerns RVS  Examination Guidelines: A complete evaluation includes B-mode imaging, spectral Doppler, color Doppler, and power Doppler as needed of all accessible portions of each vessel. Bilateral testing is considered an integral part of a complete examination. Limited examinations for reoccurring indications may be performed as noted. The reflux portion of the exam is performed with the patient in reverse Trendelenburg.  +-----+---------------+---------+-----------+----------+--------------+ RIGHTCompressibilityPhasicitySpontaneityPropertiesThrombus Aging +-----+---------------+---------+-----------+----------+--------------+ CFV  Full           Yes      Yes                                 +-----+---------------+---------+-----------+----------+--------------+   +----------+---------------+---------+-----------+----------+-----------------+ LEFT      CompressibilityPhasicitySpontaneityPropertiesThrombus Aging    +----------+---------------+---------+-----------+----------+-----------------+ CFV       None           No       No                                      +----------+---------------+---------+-----------+----------+-----------------+ SFJ                                                    Not well                                                                 visualized        +----------+---------------+---------+-----------+----------+-----------------+ FV Prox   None           No       No                                     +----------+---------------+---------+-----------+----------+-----------------+ FV Mid    None           No       No                                     +----------+---------------+---------+-----------+----------+-----------------+  cardiomegaly. Call report request placed at the time of interpretation and report issued in the interest of expediency. Final communication of critical  findings will be documented. Electronically Signed: By: Jearld Lesch M.D. On: 06/23/2023 15:02   VAS [place] LOWER EXTREMITY VENOUS (DVT) (ONLY [facility])  Result Date: 06/23/2023  Lower Venous DVT Study Patient Name:  Owen Caughlin.  Date of Exam:   06/23/2023 Medical Rec #: [id number]        Accession #:    [id number] Date of Birth: 1980-11-18        Patient Gender: M Patient Age:   42 years Exam Location:  [facility] Procedure:      VAS [place] LOWER EXTREMITY VENOUS (DVT) Referring Phys: Melton Alar --------------------------------------------------------------------------------  Indications: Cramping that began in calf 5 days ago, and Edema.  Limitations: Unable to adequately position patient secondary to stretcher chair. Comparison Study: No prior study on file Performing Technologist: Sherren Kerns RVS  Examination Guidelines: A complete evaluation includes B-mode imaging, spectral Doppler, color Doppler, and power Doppler as needed of all accessible portions of each vessel. Bilateral testing is considered an integral part of a complete examination. Limited examinations for reoccurring indications may be performed as noted. The reflux portion of the exam is performed with the patient in reverse Trendelenburg.  +-----+---------------+---------+-----------+----------+--------------+ RIGHTCompressibilityPhasicitySpontaneityPropertiesThrombus Aging +-----+---------------+---------+-----------+----------+--------------+ CFV  Full           Yes      Yes                                 +-----+---------------+---------+-----------+----------+--------------+   +----------+---------------+---------+-----------+----------+-----------------+ LEFT      CompressibilityPhasicitySpontaneityPropertiesThrombus Aging    +----------+---------------+---------+-----------+----------+-----------------+ CFV       None           No       No                                      +----------+---------------+---------+-----------+----------+-----------------+ SFJ                                                    Not well                                                                 visualized        +----------+---------------+---------+-----------+----------+-----------------+ FV Prox   None           No       No                                     +----------+---------------+---------+-----------+----------+-----------------+ FV Mid    None           No       No                                     +----------+---------------+---------+-----------+----------+-----------------+  Physician Discharge Summary  [name] ZOX:[id number] DOB: 1981-04-18 DOA: 06/23/2023  PCP: Georgianne Fick, MD  Admit date: 06/23/2023 Discharge date: 06/26/2023  Admitted From: Home  Discharge disposition: Home  Recommendations for Outpatient Follow-Up:   Follow up with your primary care provider in one week.  Check CBC, BMP, magnesium in the next visit Follow-up with Dr. Chestine Spore vascular surgery in 1 week.  Office to schedule an appointment. Will need hypercoagulable workup secondary to extensive DVT and PE.  Would recommend further workup as outpatient. Patient will likely need indefinite anticoagulation due to extensive nature of DVT and PE.Marland Kitchen   Discharge Diagnosis:   Principal Problem:   Acute pulmonary embolism (HCC) Active Problems:   Essential hypertension   Discharge Condition: Improved.  Diet recommendation:  Regular.  Wound care: None.  Code status: Full.   History of Present Illness:   Nicholas Hunter. is a 42 y.o. male with medical history significant for uveitis and immunosuppressants and CKD presented to hospital with pain and swelling of his left lower extremity which he initially thought like a pulled muscle but then it increased in size with tenderness.  He also experienced 1-2 episodes of shortness of breath without any chest pain.  In the ED, patient had normal vitals.  Labs showed D-dimer elevated at 5.2.  CT angiogram of the chest showed acute pulm embolism with borderline heart strain.  He also had extensive left lower extremity DVT on duplex ultrasound.  Patient was then considered for admission to the hospital for further evaluation and treatment.   Hospital Course:   Following conditions were addressed during hospitalization as listed below,  Acute DVT and pulmonary embolism. Patient remained hemodynamically stable.  CT scan with right heart strain.  Patient was started on heparin drip in vascular surgery was consulted who recommended  thrombectomy of the left lower extremity. CT venogram with no pelvic DVT.  Patient then underwent mechanical thrombectomy on 06/25/2023 by vascular surgery.  Of note 2D echocardiogram showed ejection fraction of 55 to 60% with LVH. Patient had normal BNP and troponin on presentation..  Etiology of the DVT uncertain etiology at this time.  Patient being followed by ophthalmology and rheumatologist as outpatient for autoimmune condition.  They could be underlying hypercoagulable state which might need to be further investigated as outpatient with rheumatologist.  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  Recommend outpatient follow-up.  History of uveitis.  Patient follows up with ophthalmologist and rheumatologist at [facility] and is on immunosuppressants with Remicade as outpatient.  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  Disposition.  At this time, patient is stable for disposition home with outpatient PCP and vascular surgery follow-up.  Would recommend follow-up with rheumatology as well.  Medical Consultants:  Vascular surgery.  Procedures:    Mechanical thrombectomy 06/25/2023. Subjective:   Today, patient was seen and examined at bedside.  Feels better.  Less leg swelling.  No breath or chest pain.  Discharge Exam:   Vitals:   06/26/23 0745 06/26/23 1147  BP: (!) 134/94 (!) 151/96  Pulse: 65   Resp: 19   Temp: 97.8 F (36.6 C) 98.4 F (36.9 C)  SpO2: 95%    Vitals:   06/25/23 2342 06/26/23 0419 06/26/23 0745 06/26/23 1147  BP: (!) 129/97 120/85 (!) 134/94 (!) 151/96  Pulse:  cardiomegaly. Call report request placed at the time of interpretation and report issued in the interest of expediency. Final communication of critical  findings will be documented. Electronically Signed: By: Jearld Lesch M.D. On: 06/23/2023 15:02   VAS [place] LOWER EXTREMITY VENOUS (DVT) (ONLY [facility])  Result Date: 06/23/2023  Lower Venous DVT Study Patient Name:  Owen Caughlin.  Date of Exam:   06/23/2023 Medical Rec #: [id number]        Accession #:    [id number] Date of Birth: 1980-11-18        Patient Gender: M Patient Age:   42 years Exam Location:  [facility] Procedure:      VAS [place] LOWER EXTREMITY VENOUS (DVT) Referring Phys: Melton Alar --------------------------------------------------------------------------------  Indications: Cramping that began in calf 5 days ago, and Edema.  Limitations: Unable to adequately position patient secondary to stretcher chair. Comparison Study: No prior study on file Performing Technologist: Sherren Kerns RVS  Examination Guidelines: A complete evaluation includes B-mode imaging, spectral Doppler, color Doppler, and power Doppler as needed of all accessible portions of each vessel. Bilateral testing is considered an integral part of a complete examination. Limited examinations for reoccurring indications may be performed as noted. The reflux portion of the exam is performed with the patient in reverse Trendelenburg.  +-----+---------------+---------+-----------+----------+--------------+ RIGHTCompressibilityPhasicitySpontaneityPropertiesThrombus Aging +-----+---------------+---------+-----------+----------+--------------+ CFV  Full           Yes      Yes                                 +-----+---------------+---------+-----------+----------+--------------+   +----------+---------------+---------+-----------+----------+-----------------+ LEFT      CompressibilityPhasicitySpontaneityPropertiesThrombus Aging    +----------+---------------+---------+-----------+----------+-----------------+ CFV       None           No       No                                      +----------+---------------+---------+-----------+----------+-----------------+ SFJ                                                    Not well                                                                 visualized        +----------+---------------+---------+-----------+----------+-----------------+ FV Prox   None           No       No                                     +----------+---------------+---------+-----------+----------+-----------------+ FV Mid    None           No       No                                     +----------+---------------+---------+-----------+----------+-----------------+  Physician Discharge Summary  [name] ZOX:[id number] DOB: 1981-04-18 DOA: 06/23/2023  PCP: Georgianne Fick, MD  Admit date: 06/23/2023 Discharge date: 06/26/2023  Admitted From: Home  Discharge disposition: Home  Recommendations for Outpatient Follow-Up:   Follow up with your primary care provider in one week.  Check CBC, BMP, magnesium in the next visit Follow-up with Dr. Chestine Spore vascular surgery in 1 week.  Office to schedule an appointment. Will need hypercoagulable workup secondary to extensive DVT and PE.  Would recommend further workup as outpatient. Patient will likely need indefinite anticoagulation due to extensive nature of DVT and PE.Marland Kitchen   Discharge Diagnosis:   Principal Problem:   Acute pulmonary embolism (HCC) Active Problems:   Essential hypertension   Discharge Condition: Improved.  Diet recommendation:  Regular.  Wound care: None.  Code status: Full.   History of Present Illness:   Nicholas Hunter. is a 42 y.o. male with medical history significant for uveitis and immunosuppressants and CKD presented to hospital with pain and swelling of his left lower extremity which he initially thought like a pulled muscle but then it increased in size with tenderness.  He also experienced 1-2 episodes of shortness of breath without any chest pain.  In the ED, patient had normal vitals.  Labs showed D-dimer elevated at 5.2.  CT angiogram of the chest showed acute pulm embolism with borderline heart strain.  He also had extensive left lower extremity DVT on duplex ultrasound.  Patient was then considered for admission to the hospital for further evaluation and treatment.   Hospital Course:   Following conditions were addressed during hospitalization as listed below,  Acute DVT and pulmonary embolism. Patient remained hemodynamically stable.  CT scan with right heart strain.  Patient was started on heparin drip in vascular surgery was consulted who recommended  thrombectomy of the left lower extremity. CT venogram with no pelvic DVT.  Patient then underwent mechanical thrombectomy on 06/25/2023 by vascular surgery.  Of note 2D echocardiogram showed ejection fraction of 55 to 60% with LVH. Patient had normal BNP and troponin on presentation..  Etiology of the DVT uncertain etiology at this time.  Patient being followed by ophthalmology and rheumatologist as outpatient for autoimmune condition.  They could be underlying hypercoagulable state which might need to be further investigated as outpatient with rheumatologist.  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  Recommend outpatient follow-up.  History of uveitis.  Patient follows up with ophthalmologist and rheumatologist at [facility] and is on immunosuppressants with Remicade as outpatient.  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  Disposition.  At this time, patient is stable for disposition home with outpatient PCP and vascular surgery follow-up.  Would recommend follow-up with rheumatology as well.  Medical Consultants:  Vascular surgery.  Procedures:    Mechanical thrombectomy 06/25/2023. Subjective:   Today, patient was seen and examined at bedside.  Feels better.  Less leg swelling.  No breath or chest pain.  Discharge Exam:   Vitals:   06/26/23 0745 06/26/23 1147  BP: (!) 134/94 (!) 151/96  Pulse: 65   Resp: 19   Temp: 97.8 F (36.6 C) 98.4 F (36.9 C)  SpO2: 95%    Vitals:   06/25/23 2342 06/26/23 0419 06/26/23 0745 06/26/23 1147  BP: (!) 129/97 120/85 (!) 134/94 (!) 151/96  Pulse:

## 2023-06-26 NOTE — TOC Transition Note (Signed)
Transition of Care (TOC) - CM/SW Discharge Note Donn Pierini RN, BSN Transitions of Care Unit 4E- RN Case Manager See Treatment Team for direct phone #   Patient Details  Name: Nicholas Hunter MRN: [id number] Date of Birth: [birth year]  Transition of Care [facility] CM/SW Contact:  Darrold Span, RN Phone Number: 06/26/2023, 11:33 AM   Clinical Narrative:    Pt stable for transition home today, family to transport home. Pt has been started on Eliquis- per benefit check copay- $45. TOC pharmacy to fill starter pack prior to discharge.   No further TOC needs noted, pt to follow up as per AVS instructions.    Final next level of care: Home/Self Care Barriers to Discharge: No Barriers Identified   Patient Goals and CMS Choice CMS Medicare.gov Compare Post Acute Care list provided to:: Patient Choice offered to / list presented to : NA  Discharge Placement                   Home      Discharge Plan and Services Additional resources added to the After Visit Summary for   In-house Referral: NA Discharge Planning Services: CM Consult, Medication Assistance Post Acute Care Choice: NA          DME Arranged: N/A DME Agency: NA       HH Arranged: NA HH Agency: NA        Social Determinants of Health (SDOH) Interventions SDOH Screenings   Food Insecurity: No Food Insecurity (06/24/2023)  Housing: Low Risk  (06/24/2023)  Transportation Needs: No Transportation Needs (06/24/2023)  Utilities: Not At Risk (06/24/2023)  Tobacco Use: Low Risk  (06/24/2023)  Recent Concern: Tobacco Use - Medium Risk (06/23/2023)   Received from [facility]     Readmission Risk Interventions    06/26/2023   11:33 AM  Readmission Risk Prevention Plan  Post Dischage Appt Complete  Medication Screening Complete  Transportation Screening Complete

## 2023-07-01 ENCOUNTER — Telehealth: Payer: Self-pay

## 2023-07-01 NOTE — Telephone Encounter (Signed)
Pt called asking if having a dental exam and cleaning would be problematic now that he is on a blood thinner.  Reviewed pt's chart, returned call for clarification, two identifiers used. Informed him that a basic cleaning would be fine. If he ever needed oral surgery or teeth extractions, have the dental office reach out to [place]. Confirmed understanding.

## 2023-07-21 ENCOUNTER — Other Ambulatory Visit (HOSPITAL_COMMUNITY): Payer: Self-pay

## 2023-07-29 ENCOUNTER — Encounter (HOSPITAL_COMMUNITY): Payer: PRIVATE HEALTH INSURANCE

## 2023-08-01 ENCOUNTER — Other Ambulatory Visit: Payer: Self-pay | Admitting: *Deleted

## 2023-08-01 DIAGNOSIS — I82402 Acute embolism and thrombosis of unspecified deep veins of left lower extremity: Secondary | ICD-10-CM

## 2023-08-05 ENCOUNTER — Other Ambulatory Visit (HOSPITAL_COMMUNITY): Payer: Self-pay

## 2023-08-05 ENCOUNTER — Other Ambulatory Visit: Payer: Self-pay

## 2023-08-09 ENCOUNTER — Ambulatory Visit (HOSPITAL_COMMUNITY)
Admission: RE | Admit: 2023-08-09 | Discharge: 2023-08-09 | Disposition: A | Discharge status: Home / Self Care | Payer: PRIVATE HEALTH INSURANCE | Source: Ambulatory Visit | Attending: Surgery | Admitting: Surgery

## 2023-08-09 ENCOUNTER — Ambulatory Visit (INDEPENDENT_AMBULATORY_CARE_PROVIDER_SITE_OTHER)
Admission: RE | Admit: 2023-08-09 | Discharge: 2023-08-09 | Disposition: A | Discharge status: Home / Self Care | Payer: PRIVATE HEALTH INSURANCE | Source: Ambulatory Visit | Attending: Surgery | Admitting: Surgery

## 2023-08-09 DIAGNOSIS — Z86718 Personal history of other venous thrombosis and embolism: Secondary | ICD-10-CM | POA: Insufficient documentation

## 2023-08-09 DIAGNOSIS — I82402 Acute embolism and thrombosis of unspecified deep veins of left lower extremity: Secondary | ICD-10-CM | POA: Insufficient documentation

## 2023-08-09 DIAGNOSIS — I82412 Acute embolism and thrombosis of left femoral vein: Secondary | ICD-10-CM | POA: Diagnosis present

## 2023-08-09 DIAGNOSIS — I82432 Acute embolism and thrombosis of left popliteal vein: Secondary | ICD-10-CM | POA: Diagnosis present

## 2023-08-09 DIAGNOSIS — I82422 Acute embolism and thrombosis of left iliac vein: Secondary | ICD-10-CM | POA: Diagnosis present

## 2023-08-09 DIAGNOSIS — Z9889 Other specified postprocedural states: Secondary | ICD-10-CM | POA: Diagnosis not present

## 2023-08-12 ENCOUNTER — Encounter: Payer: Self-pay | Admitting: Physician Assistant

## 2023-08-12 ENCOUNTER — Ambulatory Visit (INDEPENDENT_AMBULATORY_CARE_PROVIDER_SITE_OTHER): Payer: Managed Care, Other (non HMO) | Admitting: Physician Assistant

## 2023-08-12 VITALS — BP 128/77 | HR 84 | Temp 99.9°F | Ht 75.0 in | Wt 303.7 lb

## 2023-08-12 DIAGNOSIS — I82402 Acute embolism and thrombosis of unspecified deep veins of left lower extremity: Secondary | ICD-10-CM | POA: Diagnosis not present

## 2023-08-12 NOTE — Progress Notes (Unsigned)
POST OPERATIVE OFFICE NOTE    CC:  F/u for surgery  HPI:  This is a 42 y.o. male who is s/p IVUS and mechanical thrombectomy of the left common iliac, left external iliac, CF, FV, and popliteal veins with balloon venoplasty of the left common iliac, external iliac, CF, FV and popliteal veins; Left leg and IVC venogram on 06/25/23 by Dr. Myra Gianotti. He was discharged home on Eliquis.   Pt returns today for follow up.  Pt states overall he is doing much better. He does get some swelling still in his leg and occasional pressure but he does not have any pain. He has been using his thigh high compression stockings daily. He works very active job where he is on his feet all day. He has been taking his Eliquis daily. He has not seen Hematology since d/c. He still has some concerns regarding why he got his DVT/PE. He would like to return to doing his lifting and cardio regimen.   No Known Allergies  Current Outpatient Medications  Medication Sig Dispense Refill   amLODipine (NORVASC) 10 MG tablet Take 1 tablet (10 mg total) by mouth daily. 30 tablet 5   APIXABAN (ELIQUIS) VTE STARTER PACK (10MG  AND 5MG ) Take as directed on package: start with two-5mg  tablets twice daily for 7 days. On day 8, switch to one-5mg  tablet twice daily. 74 each 0   inFLIXimab (REMICADE) 100 MG injection To be administered at MD's office every 8 weeks at 8 mg/kg (pt wt = 140 kg). 12 each prn   mycophenolate (CELLCEPT) 500 MG tablet Take 1,500 mg by mouth 2 (two) times daily.     olmesartan-hydrochlorothiazide (BENICAR HCT) 20-12.5 MG tablet Take 1 tablet by mouth daily.     prednisoLONE acetate (PRED FORTE) 1 % ophthalmic suspension Place 1 drop into the left eye 4 (four) times daily.     rosuvastatin (CRESTOR) 5 MG tablet Take 1 tablet (5 mg total) by mouth daily. 90 tablet 3   sirolimus (RAPAMUNE) 2 MG tablet Take 1 tablet by mouth daily.     No current facility-administered medications for this visit.     ROS:  See  HPI  Physical Exam:  Vitals:   08/12/23 1509  BP: 128/77  Pulse: 84  Temp: 99.9 F (37.7 C)  SpO2: 96%   General: well appearing, well nourished, pleasant male Cardiac: regular Lungs: non labored Extremities:  BLE well perfused and warm with palpable Dp pulses bilaterally. Thigh high compression stockings in place. Left leg slightly larger than right Neuro: alert and oriented  Non invasive Vascular lab: 08/09/23 +---------+---------------+---------+-----------+---------------+----------  ----+  LEFT    CompressibilityPhasicitySpontaneityProperties     Thrombus  Aging  +---------+---------------+---------+-----------+---------------+----------  ----+  CFV     Full                                                               +---------+---------------+---------+-----------+---------------+----------  ----+  SFJ     Full                                                               +---------+---------------+---------+-----------+---------------+----------  ----+  FV Prox  Partial                                           Chronic          +---------+---------------+---------+-----------+---------------+----------  ----+  FV Mid   Partial                                           Chronic          +---------+---------------+---------+-----------+---------------+----------  ----+  FV DistalPartial                                           Chronic          +---------+---------------+---------+-----------+---------------+----------  ----+  POP     None                               softly         Acute                                                        echogenic                       +---------+---------------+---------+-----------+---------------+----------  ----+  PTV     Partial                                                             +---------+---------------+---------+-----------+---------------+----------  ----+  GSV     Full                                                               +---------+---------------+---------+-----------+---------------+----------  ----+  SSV     Full                                                               +---------+---------------+---------+-----------+---------------+----------  ----+    Findings reported to Aggie Moats who instructed patient to keep his appt with [place] for 08/12/2023.    Summary:   LEFT:  - Findings consistent with acute appearing deep vein thrombosis involving the left proximal popliteal vein.  - Findings consistent with age indeterminate deep vein thrombosis involving the left mid popliteal vein.   - Findings consistent with chronic deep vein thrombosis involving the left femoral vein, and left posterior tibial veins.  The  peroneal veins were not visualized. The posterior tibial veins were observed in the distal calf only.    Assessment/Plan:  This is a 42 y.o. male who is s/p: IVUS and mechanical thrombectomy of the left common iliac, left external iliac, CF, FV, and popliteal veins with balloon venoplasty of the left common iliac, external iliac, CF, FV and popliteal veins; Left leg and IVC venogram on 06/25/23 by Dr. Myra Gianotti. He was discharged home on Eliquis. His symptoms are improved.  - Duplex today shows some chronic venous thrombus. I suspect that this is what is present in his popliteal vein as well. The acute appearing thrombus is right at the access site for the mechanical thrombectomy and generally this area is not treated extensively. Suspect this is just some chronic thrombus. Discussed these findings with Dr. Myra Gianotti and he agrees with these findings and plan - He is okay to resume his exercise regimen - Recommend he follow up and arrange hematology evaluation - Continue daily use of compression stockings - Continue Eliquis. No  evidence of treatment failure at this time - he will follow up again in 6 months with repeat IVC/iliac and LLE venous duplex - he knows to call for earlier follow up if any new or concerning symptoms - If his follow up duplex is stable he can then go to annual follow up    Nathanial Rancher, [facility] [phone]   Clinic MD:  Myra Gianotti

## 2023-08-28 ENCOUNTER — Other Ambulatory Visit (HOSPITAL_COMMUNITY): Payer: Self-pay

## 2023-08-29 ENCOUNTER — Other Ambulatory Visit: Payer: Self-pay

## 2023-08-29 DIAGNOSIS — I82402 Acute embolism and thrombosis of unspecified deep veins of left lower extremity: Secondary | ICD-10-CM

## 2024-02-10 ENCOUNTER — Ambulatory Visit (HOSPITAL_COMMUNITY): Payer: PRIVATE HEALTH INSURANCE

## 2024-02-10 ENCOUNTER — Ambulatory Visit: Payer: PRIVATE HEALTH INSURANCE

## 2024-03-03 NOTE — Progress Notes (Signed)
 Patient ID:  Nicholas Hunter is a 43 y.o. (DOB [birth year]) male.    Assessment and Plan   Assessment & Plan 1. Left lower extremity deep vein thrombosis (DVT) and pulmonary embolism (PE). He was diagnosed with a left lower extremity DVT and PE in September 2024. Mechanical thrombectomy was performed on 06/15/2023. A repeat Doppler ultrasound on 08/09/2023 showed residual clots post-thrombectomy. He is currently on Eliquis  5 mg twice a day and tolerating it well. Given his underlying risk factors, including uveitis, he remains at higher risk for recurrent DVT or PE. Long-term anticoagulation is recommended. A repeat ultrasound has been ordered and scheduled for next week. He will continue Eliquis  5 mg twice a day for about 1 year, after which the dose may be reduced to 2.5 mg twice a day. Follow-up in mid-September 2025 to discuss reducing the Eliquis  dose.  The treatment plan involves continuing Eliquis  5 mg twice a day for at least one year to manage the risk of recurrent DVT or PE. After one year, the dose may be reduced to 2.5 mg twice a day to minimize the risk of bleeding and potential kidney issues. The patient will be monitored for any side effects, particularly bleeding and bruising. Kidney function will also be monitored, with recent lab work showing stable creatinine levels. The goal of the treatment is to prevent further clot formation while minimizing side effects. Alternatives to Eliquis  were not discussed, as the patient is tolerating the medication well.  2. Erythrocytosis. Erythrocytosis was noted on prior lab work. MPN testing was conducted and returned negative. These results were discussed today. No further workup is planned at this point.  3. Uveitis. He has a longstanding history of uveitis and is currently on Remicade . He is responding well to this medication. He will continue to follow up with Rheumatology and Ophthalmology for this condition.  4. Stage 3 chronic kidney disease  (CKD). CKD is due to prior cyclosporine  use, which has now been discontinued. Recent CMP on 02/12/2024 showed stable creatinine at 1.29. This will continue to be monitored while on Eliquis .  5. Hypertension. Currently on amlodipine  and olmesartan for hypertension. Blood pressure is well controlled today. He will continue these medications and follow up with his PCP.  6. Long-term use of anticoagulation. Currently on Eliquis  5 mg twice a day. Renal function will continue to be monitored while on this medication. Last lab work showed stable renal function. Follow-up in mid-September 2025 for repeat lab work and to discuss decreasing the dose of Eliquis  to 2.5 mg twice a day.  Follow-up Follow up in mid-September 2025.    Subjective   Patient ID:  Nicholas Hunter is a 43 y.o. (DOB 08/04/1981) male    Patient presents with  . Follow-up    History of Present Illness The patient is a 43 year old male who is seen today for a routine follow-up for a history of deep vein thrombosis (DVT) and pulmonary embolism (PE).  He was noted to have an acute DVT involving the left common femoral vein, left femoral vein, left popliteal vein, left posterior tibial veins, and left peroneal veins on 06/23/2023. Ultrasound also showed findings consistent with acute intramuscular thrombosis involving the left gastrocnemius veins. A CTA chest performed on the same date revealed pulmonary embolism with lobar and segmental embolus throughout the right lung and subsegmental embolus in the left lung. He underwent mechanical thrombectomy on 06/15/2023. A repeat Doppler ultrasound of the left lower extremity performed on 08/09/2023 showed findings consistent  with acute appearing DVT involving the left proximal popliteal vein, age indeterminate, and DVT involving the left mid popliteal vein and chronic DVT involving the left femoral vein and left posterior tibial veins.  He reports occasional mild swelling in his leg but is not  experiencing any associated pain. He is not experiencing any chest pain or shortness of breath. He is currently on Eliquis  5 mg twice daily and reports no adverse effects from this medication. He mentions a recent facial injury that resulted in significant bleeding.  He was also noted to have erythrocytosis on prior lab work, and additional workup was obtained on 02/12/2024, which showed an RBC of 5.52, hemoglobin 14.9, hematocrit 46.0, and a negative JAK2. CALR, MPL, and E12-15 were all negative as well. He underwent further testing by his primary care provider in 06/2023, which showed a negative lupus anticoagulant. Factor Hunter Leiden testing was negative, and factor II DNA analysis was also negative.  He is currently being treated for uveitis and has been on Remicade  since 2012.    Reviewed and updated this visit by provider: Tobacco  Allergies  Meds  Problems  Med Hx  Surg Hx  Fam Hx        Review of Systems  Constitutional:  Negative for chills, fatigue and fever.  HENT: Negative.    Eyes: Negative.   Respiratory:  Negative for cough, chest tightness and shortness of breath.   Cardiovascular:  Negative for chest pain and leg swelling.  Gastrointestinal:  Negative for abdominal pain, constipation, diarrhea, nausea and vomiting.  Endocrine: Negative.   Genitourinary: Negative.   Musculoskeletal: Negative.   Skin: Negative.   Neurological:  Negative for dizziness, seizures and headaches.  Hematological: Negative.   Psychiatric/Behavioral: Negative.       Objective   Vitals:   03/03/24 1430  BP: 130/86  Patient Position: Sitting  Pulse: 74  Temp: 97.5 F (36.4 C)  TempSrc: Temporal  Resp: 18  Height: 6' 2 (1.88 m)  Weight: (!) 316 lb (143.3 kg)  SpO2: 96%  BMI (Calculated): 40.6  PainSc: 0-No pain     Physical Exam Vitals reviewed.  Constitutional:      General: He is not in acute distress. HENT:     Head: Normocephalic.  Eyes:     General: No scleral  icterus.    Conjunctiva/sclera: Conjunctivae normal.  Cardiovascular:     Rate and Rhythm: Normal rate and regular rhythm.  Musculoskeletal:        General: Normal range of motion.     Right lower leg: No edema.     Left lower leg: No edema.  Pulmonary:     Effort: Pulmonary effort is normal. No respiratory distress.     Breath sounds: Normal breath sounds.  Abdominal:     General: Bowel sounds are normal.     Palpations: Abdomen is soft.     Tenderness: There is no abdominal tenderness.  Skin:    General: Skin is warm and dry.  Neurological:     Mental Status: He is alert and oriented to person, place, and time.  Psychiatric:        Mood and Affect: Mood normal.        Behavior: Behavior normal.        Thought Content: Thought content normal.        Judgment: Judgment normal.       Results Labs  - RBC: 02/12/2024, 5.52  - Hemoglobin: 02/12/2024, 14.9  - Hematocrit: 02/12/2024, 46.0  -  JAK2: 02/12/2024, Negative  - CALR: 02/12/2024, Negative  - MPL: 02/12/2024, Negative  - E12-15: 02/12/2024, Negative  - Creatinine: 02/12/2024, 1.29  - GFR: 02/12/2024, 72  Imaging  - Ultrasound of the left lower extremity: 06/23/2023, Acute DVT involving the left common femoral vein, left femoral vein, left popliteal vein, left posterior tibial veins, and left peroneal veins. Acute intramuscular thrombosis involving the left gastrocnemius veins.  - CTA chest: 06/23/2023, Pulmonary embolism with lobar and segmental embolus throughout the right lung and subsegmental embolus in the left lung.  - Repeat Doppler ultrasound of the left lower extremity: 08/09/2023, Acute appearing DVT involving the left proximal popliteal vein age indeterminate and DVT involving the left mid popliteal vein and chronic DVT involving the left femoral vein and left posterior tibial veins.  No results found for this or any previous visit (from the past 72 hours).    No results found for this or any previous visit  (from the past 72 hours).     Kristin R Curcio, NP seen for and under the supervision of Dr. Romana Quay.  03/04/2024, 1:58 PM   Computer technology was used to create visit note. Consent from the patient/caregiver was obtained prior to its use.

## 2024-03-09 NOTE — Progress Notes (Signed)
 [facility]  [facility] [place] [address] [place] [address] Dept: [phone]     Date: 03/09/2024                                                           Ordering Provider:  Redell Ness, PA   Nicholas Hunter      September 12, 1981           [id number]  Mode of Arrival:  ambulatory Transportation: self  Encounter Diagnoses  Name Primary?  . Focal choroiditis and chorioretinitis, peripheral, bilateral   . High risk medication use   . Panuveitis of both eyes Yes    Vitals:   03/09/24 0930 03/09/24 0945 03/09/24 1015 03/09/24 1050  BP: (!) 139/93 129/76 129/85 132/81  Pulse: 68 70 74 65  Temp:      SpO2: 98% 97% 98% 98%  Weight:            Medication  Remicade  Dose: 700 mg/250 ml Wastage: 0 Medication provided by [facility]   22 gauge angiocatheter was placed in the left antecubital on the 1st attempt. Blood return observed, catheter was flushed with normal saline without difficulty and is absent of redness, swelling or pain; site secured with dressing and taped appropriately for infusion. Access was removed per protocol after completion of infusion.  The catheter tip was intact.  No redness, swelling or pain noted at the insertion site.  Area was covered with gauze and coban.  Patient tolerated the infusion well.   Patient was discharged ambulatory  Labs collected: Quantiferon TB  Last Quantiferon was drawn 03/08/2023 and the result was Negative  Infusion Nurse Signature:  Sotero [name], RN  Supervision: Aldona Ziolkowska, MD   Infusion start time  0845  Infusion Stop time  1050 Electronically signed by: Sotero [name], RN 03/09/2024 10:53 AM

## 2024-03-12 NOTE — Progress Notes (Signed)
 Spoke with [facility] at [facility], requesting access to ultrasound images from last year, for patient, for comparison.  Request faxed as requested and scanned to patient chart.  Provider notified LAURINE Bud, FNP)

## 2024-03-25 NOTE — Progress Notes (Signed)
 In following up on release of ultrasound images from patient's previous visits (06/23/23 and 08/09/23) with [facility], images were sent but do not cross over to be viewed by radiologist at [facility].  Spoke with [facility] and was advised it may be easier for patient to pick up copies of images or sign to have them released directly to [facility].  Called patient and patient agreeable to go to [facility] and release images to [facility].  Patient advised to call [facility] clinic for any issues.

## 2024-03-30 ENCOUNTER — Other Ambulatory Visit: Payer: Self-pay

## 2024-03-30 DIAGNOSIS — I82402 Acute embolism and thrombosis of unspecified deep veins of left lower extremity: Secondary | ICD-10-CM

## 2024-04-13 ENCOUNTER — Ambulatory Visit (HOSPITAL_COMMUNITY): Payer: PRIVATE HEALTH INSURANCE
